# Patient Record
Sex: Male | Born: 1955 | Race: White | Hispanic: No | Smoking: Never smoker
Health system: Southern US, Community
[De-identification: ages and names within clinical notes are randomized; demographics above are authoritative.]

## PROBLEM LIST (undated history)

## (undated) DIAGNOSIS — M545 Low back pain, unspecified: Secondary | ICD-10-CM

## (undated) DIAGNOSIS — N189 Chronic kidney disease, unspecified: Secondary | ICD-10-CM

## (undated) DIAGNOSIS — K219 Gastro-esophageal reflux disease without esophagitis: Secondary | ICD-10-CM

## (undated) DIAGNOSIS — E785 Hyperlipidemia, unspecified: Secondary | ICD-10-CM

## (undated) DIAGNOSIS — L409 Psoriasis, unspecified: Secondary | ICD-10-CM

## (undated) HISTORY — DX: Low back pain, unspecified: M54.50

## (undated) HISTORY — DX: Hyperlipidemia, unspecified: E78.5

## (undated) HISTORY — DX: Psoriasis, unspecified: L40.9

## (undated) HISTORY — PX: LUMBAR DISC SURGERY: SHX700

## (undated) HISTORY — PX: WISDOM TOOTH EXTRACTION: SHX21

## (undated) HISTORY — PX: BACK SURGERY: SHX140

## (undated) HISTORY — DX: Low back pain: M54.5

---

## 1977-05-24 HISTORY — PX: APPENDECTOMY: SHX54

## 1987-05-25 HISTORY — PX: ANKLE SURGERY: SHX546

## 2010-11-18 ENCOUNTER — Ambulatory Visit: Payer: Self-pay | Admitting: Chiropractic Medicine

## 2014-08-07 LAB — PSA

## 2015-02-18 ENCOUNTER — Other Ambulatory Visit: Payer: Self-pay | Admitting: Unknown Physician Specialty

## 2015-02-18 NOTE — Telephone Encounter (Signed)
Pt needs check further refills 

## 2015-02-24 DIAGNOSIS — M545 Low back pain, unspecified: Secondary | ICD-10-CM | POA: Insufficient documentation

## 2015-02-24 DIAGNOSIS — N181 Chronic kidney disease, stage 1: Secondary | ICD-10-CM | POA: Insufficient documentation

## 2015-02-24 DIAGNOSIS — I10 Essential (primary) hypertension: Secondary | ICD-10-CM

## 2015-02-24 DIAGNOSIS — G8929 Other chronic pain: Secondary | ICD-10-CM | POA: Insufficient documentation

## 2015-02-24 DIAGNOSIS — E785 Hyperlipidemia, unspecified: Secondary | ICD-10-CM | POA: Insufficient documentation

## 2015-02-24 DIAGNOSIS — L409 Psoriasis, unspecified: Secondary | ICD-10-CM | POA: Insufficient documentation

## 2015-02-25 ENCOUNTER — Encounter: Payer: Self-pay | Admitting: Unknown Physician Specialty

## 2015-02-25 ENCOUNTER — Ambulatory Visit (INDEPENDENT_AMBULATORY_CARE_PROVIDER_SITE_OTHER): Payer: BC Managed Care – PPO | Admitting: Unknown Physician Specialty

## 2015-02-25 VITALS — BP 110/74 | HR 64 | Temp 98.2°F | Ht 67.3 in | Wt 187.8 lb

## 2015-02-25 DIAGNOSIS — I10 Essential (primary) hypertension: Secondary | ICD-10-CM | POA: Diagnosis not present

## 2015-02-25 DIAGNOSIS — E785 Hyperlipidemia, unspecified: Secondary | ICD-10-CM

## 2015-02-25 DIAGNOSIS — N181 Chronic kidney disease, stage 1: Secondary | ICD-10-CM

## 2015-02-25 DIAGNOSIS — Z23 Encounter for immunization: Secondary | ICD-10-CM

## 2015-02-25 LAB — MICROALBUMIN, URINE WAIVED
Creatinine, Urine Waived: 100 mg/dL (ref 10–300)
Microalb, Ur Waived: 80 mg/L — ABNORMAL HIGH (ref 0–19)

## 2015-02-25 LAB — LIPID PANEL PICCOLO, WAIVED
CHOLESTEROL PICCOLO, WAIVED: 158 mg/dL (ref <200)
Chol/HDL Ratio Piccolo,Waive: 3.1 mg/dL
HDL CHOL PICCOLO, WAIVED: 52 mg/dL — AB (ref >59)
LDL Chol Calc Piccolo Waived: 85 mg/dL (ref <100)
TRIGLYCERIDES PICCOLO,WAIVED: 110 mg/dL (ref <150)
VLDL CHOL CALC PICCOLO,WAIVE: 22 mg/dL (ref <30)

## 2015-02-25 MED ORDER — LISINOPRIL 2.5 MG PO TABS: 2.5000 mg | ORAL_TABLET | Freq: Every day | ORAL | Status: DC

## 2015-02-25 MED ORDER — CLOBETASOL PROPIONATE 0.05 % EX CREA: 1.0000 "application " | TOPICAL_CREAM | Freq: Two times a day (BID) | CUTANEOUS | Status: DC

## 2015-02-25 MED ORDER — ATORVASTATIN CALCIUM 20 MG PO TABS: 20.0000 mg | ORAL_TABLET | Freq: Every day | ORAL | Status: DC

## 2015-02-25 NOTE — Progress Notes (Signed)
BP 110/74 mmHg  Pulse 64  Temp(Src) 98.2 F (36.8 C)  Ht 5' 7.3" (1.709 m)  Wt 187 lb 12.8 oz (85.186 kg)  BMI 29.17 kg/m2  SpO2 97%   Subjective:    Patient ID: Colton Brown, male    DOB: 1955/09/08, 59 y.o.   MRN: [id number]  HPI: Colton Brown is a 59 y.o. male  Chief Complaint  Patient presents with  . Hyperlipidemia  . Hypertension  . Medication Refill    pt states he needs his clobetasol 0.05% cream refilled- requests 60 gram tube   Hypertension/Hyperlipidemia: Compliant with medications, denies missed doses. Has not monitored his blood pressure at home since starting lisinopril. Denies headaches, chest pain, shortness of breath or dizziness.  He remains active with stable weight.     Relevant past medical, surgical, family and social history reviewed and updated as indicated. Interim medical history since our last visit reviewed. Allergies and medications reviewed and updated.  Review of Systems  Constitutional: Negative.  Negative for fever, chills, activity change and appetite change.  HENT: Negative.  Negative for rhinorrhea, sneezing and sore throat.   Eyes: Negative.  Negative for redness and itching.  Respiratory: Negative.  Negative for cough, chest tightness, shortness of breath and wheezing.   Cardiovascular: Negative for chest pain, palpitations and leg swelling.  Gastrointestinal: Negative.  Negative for abdominal pain.  Genitourinary: Negative.   Musculoskeletal: Negative.  Negative for myalgias, joint swelling, arthralgias and gait problem.  Skin: Negative.  Negative for color change, pallor, rash and wound.  Neurological: Negative.  Negative for dizziness, seizures, light-headedness and headaches.  Psychiatric/Behavioral: Negative.  Negative for confusion and sleep disturbance. The patient is not nervous/anxious and is not hyperactive.     Per HPI unless specifically indicated above     Objective:    BP 110/74 mmHg  Pulse 64  Temp(Src) 98.2 F  (36.8 C)  Ht 5' 7.3" (1.709 m)  Wt 187 lb 12.8 oz (85.186 kg)  BMI 29.17 kg/m2  SpO2 97%  Wt Readings from Last 3 Encounters:  02/25/15 187 lb 12.8 oz (85.186 kg)  08/07/14 188 lb (85.276 kg)    Physical Exam  Constitutional: He is oriented to person, place, and time. He appears well-developed and well-nourished. No distress.  HENT:  Head: Normocephalic and atraumatic.  Eyes: Conjunctivae are normal. Right eye exhibits no discharge. Left eye exhibits no discharge.  Cardiovascular: Normal rate, regular rhythm and normal heart sounds.  Exam reveals no gallop and no friction rub.   No murmur heard. Pulmonary/Chest: Effort normal and breath sounds normal. No respiratory distress. He has no wheezes. He has no rales. He exhibits no tenderness.  Musculoskeletal: Normal range of motion. He exhibits no edema or tenderness.  Neurological: He is alert and oriented to person, place, and time.  Skin: Skin is warm and dry. No rash noted. He is not diaphoretic. No erythema. No pallor.  Psychiatric: He has a normal mood and affect. His behavior is normal. Judgment and thought content normal.        Assessment & Plan:   Problem List Items Addressed This Visit      Unprioritized   Hyperlipidemia    Well controlled on atorvastatin.      Relevant Medications   aspirin 81 MG tablet   atorvastatin (LIPITOR) 20 MG tablet   lisinopril (PRINIVIL,ZESTRIL) 2.5 MG tablet   Other Relevant Orders   Lipid Panel Piccolo, Waived   Hypertension    Well controlled on lisinopril.  Continue current regimen.       Relevant Medications   aspirin 81 MG tablet   atorvastatin (LIPITOR) 20 MG tablet   lisinopril (PRINIVIL,ZESTRIL) 2.5 MG tablet   Other Relevant Orders   Comprehensive metabolic panel   CKD (chronic kidney disease)    Stage 1. Blood pressure well controlled. CMP pending.      Relevant Orders   Microalbumin, Urine Waived   Uric acid    Other Visit Diagnoses    Immunization due    -   Primary    Relevant Orders    Flu Vaccine QUAD 36+ mos PF IM (Fluarix & Fluzone Quad PF) (Completed)        Follow up plan: Return in about 6 months (around 08/26/2015).

## 2015-02-25 NOTE — Assessment & Plan Note (Signed)
Well controlled on lisinopril. Continue current regimen.

## 2015-02-25 NOTE — Assessment & Plan Note (Addendum)
Reviewed lipid panel.  Well controlled on atorvastatin.

## 2015-02-25 NOTE — Assessment & Plan Note (Signed)
Stage 1. Blood pressure well controlled. CMP pending.

## 2015-02-26 ENCOUNTER — Encounter: Payer: Self-pay | Admitting: Unknown Physician Specialty

## 2015-02-26 LAB — COMPREHENSIVE METABOLIC PANEL
A/G RATIO: 1.9 (ref 1.1–2.5)
ALBUMIN: 4.6 g/dL (ref 3.5–5.5)
ALT: 41 IU/L (ref 0–44)
AST: 19 IU/L (ref 0–40)
Alkaline Phosphatase: 74 IU/L (ref 39–117)
BILIRUBIN TOTAL: 1 mg/dL (ref 0.0–1.2)
BUN / CREAT RATIO: 11 (ref 9–20)
BUN: 10 mg/dL (ref 6–24)
CALCIUM: 9.5 mg/dL (ref 8.7–10.2)
CHLORIDE: 100 mmol/L (ref 97–108)
CO2: 24 mmol/L (ref 18–29)
Creatinine, Ser: 0.91 mg/dL (ref 0.76–1.27)
GFR, EST AFRICAN AMERICAN: 108 mL/min/{1.73_m2} (ref >59)
GFR, EST NON AFRICAN AMERICAN: 93 mL/min/{1.73_m2} (ref >59)
Globulin, Total: 2.4 g/dL (ref 1.5–4.5)
Glucose: 100 mg/dL — ABNORMAL HIGH (ref 65–99)
POTASSIUM: 4.5 mmol/L (ref 3.5–5.2)
Sodium: 137 mmol/L (ref 134–144)
TOTAL PROTEIN: 7 g/dL (ref 6.0–8.5)

## 2015-02-26 LAB — URIC ACID: URIC ACID: 5.5 mg/dL (ref 3.7–8.6)

## 2015-08-26 ENCOUNTER — Telehealth: Payer: Self-pay

## 2015-08-26 ENCOUNTER — Ambulatory Visit (INDEPENDENT_AMBULATORY_CARE_PROVIDER_SITE_OTHER): Payer: BC Managed Care – PPO | Admitting: Unknown Physician Specialty

## 2015-08-26 ENCOUNTER — Encounter: Payer: Self-pay | Admitting: Unknown Physician Specialty

## 2015-08-26 VITALS — BP 114/72 | HR 66 | Temp 98.2°F | Ht 66.3 in | Wt 191.3 lb

## 2015-08-26 DIAGNOSIS — L409 Psoriasis, unspecified: Secondary | ICD-10-CM

## 2015-08-26 DIAGNOSIS — I10 Essential (primary) hypertension: Secondary | ICD-10-CM | POA: Diagnosis not present

## 2015-08-26 DIAGNOSIS — R1011 Right upper quadrant pain: Secondary | ICD-10-CM | POA: Diagnosis not present

## 2015-08-26 DIAGNOSIS — E785 Hyperlipidemia, unspecified: Secondary | ICD-10-CM

## 2015-08-26 LAB — LIPID PANEL PICCOLO, WAIVED
CHOL/HDL RATIO PICCOLO,WAIVE: 2.9 mg/dL
CHOLESTEROL PICCOLO, WAIVED: 159 mg/dL (ref <200)
HDL Chol Piccolo, Waived: 54 mg/dL — ABNORMAL LOW (ref >59)
LDL Chol Calc Piccolo Waived: 81 mg/dL (ref <100)
Triglycerides Piccolo,Waived: 118 mg/dL (ref <150)
VLDL Chol Calc Piccolo,Waive: 24 mg/dL (ref <30)

## 2015-08-26 MED ORDER — ATORVASTATIN CALCIUM 20 MG PO TABS: 20.0000 mg | ORAL_TABLET | Freq: Every day | ORAL | Status: DC

## 2015-08-26 MED ORDER — CLOBETASOL PROPIONATE 0.05 % EX CREA: 1.0000 "application " | TOPICAL_CREAM | Freq: Two times a day (BID) | CUTANEOUS | Status: DC

## 2015-08-26 MED ORDER — CLOBETASOL PROPIONATE 0.05 % EX SOLN: 1.0000 "application " | Freq: Two times a day (BID) | CUTANEOUS | Status: DC | PRN

## 2015-08-26 MED ORDER — LISINOPRIL 2.5 MG PO TABS: 2.5000 mg | ORAL_TABLET | Freq: Every day | ORAL | Status: DC

## 2015-08-26 NOTE — Assessment & Plan Note (Signed)
Pt reports improvement with medications; denies complication . Pt given a refill.

## 2015-08-26 NOTE — Assessment & Plan Note (Addendum)
LDL 81.  Continue current medication regimen.

## 2015-08-26 NOTE — Telephone Encounter (Signed)
Tried calling patient to notify him of his appointment to have an ultrasound.  No answer, left message for patient to return my phone call.   Appointment is 09-02-2015 at 8:45am. No food or drink 6 hours before, and arrive 15 minutes early. This will be at the [facility] off of [address].

## 2015-08-26 NOTE — Assessment & Plan Note (Addendum)
Pt report RUQ pain, intermittently, for several years. Pain present today. Collected Amylase and Lipase. Ordered abdominal ultrasound.

## 2015-08-26 NOTE — Progress Notes (Signed)
BP 114/72 mmHg  Pulse 66  Temp(Src) 98.2 F (36.8 C)  Ht 5' 6.3" (1.684 m)  Wt 191 lb 4.8 oz (86.773 kg)  BMI 30.60 kg/m2  SpO2 98%   Subjective:    Patient ID: Colton Brown, male    DOB: 07-08-1955, 60 y.o.   MRN: [id number]  HPI: Colton Brown is a 60 y.o. male  Chief Complaint  Patient presents with  . Hyperlipidemia  . Hypertension    Pt presents in clinic today for 6 month f/u on HTN and HLD. Pt reports doing well on medications and no complaints.Pt reports RUQ abdominal pain, intermittently for several years. Pt denies previous diagnosis to cause of abdominal pain. Per pt states that he was seen at a local ER in the 90's and told to return when he has active pain. Pt reported history of an negative abdominal ultrasound. Pt states he has the pain at least once a month; however, having the pain today. Pt also requesting medication refills for Clobetasol cream and solution for mild psoriasis.   Hypertension Using medications without difficulty. Average home BPs: do not take BP daily at home.    No problems or lightheadedness. No chest pain with exertion or shortness of breath No Edema   Hyperlipidemia Using medications without problems: No Muscle aches  Diet compliance: pt reports low-salt, fresh fruits and vegetable diet.  Exercise: pt goes twice a week to the gym for at least 1 hour/day.   Abdominal Pain Onset of RUQ pain last night, in the middle of the night.  Pain described as a non-radiating, achy pain and soreness. Denies N/V, CP, SOB No medications taken to relieve the pain, last night or today. In the past, pt reports tylenol has helped.  Pt was also prescribed Maalox to take a couple of years ago, but has stopped taking the medication.  Per pt the pain is worsened with eating greasy foods; which he has tried to remove from his diet.    Psoriasis Pt doing well with topical cream and solution for mild psoriasis.  Pt denies complications with medication Pt  requesting refill.    Relevant past medical, surgical, family and social history reviewed and updated as indicated. Interim medical history since our last visit reviewed. Allergies and medications reviewed and updated.  Review of Systems  Constitutional: Negative.  Negative for activity change, appetite change, fatigue and unexpected weight change.  Eyes: Negative.  Negative for visual disturbance.  Respiratory: Negative for cough, chest tightness and shortness of breath.   Cardiovascular: Negative for chest pain, palpitations and leg swelling.  Gastrointestinal: Positive for abdominal pain (RUQ pain, intermittently present ). Negative for nausea, vomiting, diarrhea, constipation, blood in stool and abdominal distention.  Genitourinary: Negative.   Musculoskeletal: Negative for myalgias and arthralgias.  Skin: Negative.   Neurological: Negative.  Negative for dizziness, syncope, weakness, light-headedness and headaches.  Psychiatric/Behavioral: Negative.     Per HPI unless specifically indicated above     Objective:    BP 114/72 mmHg  Pulse 66  Temp(Src) 98.2 F (36.8 C)  Ht 5' 6.3" (1.684 m)  Wt 191 lb 4.8 oz (86.773 kg)  BMI 30.60 kg/m2  SpO2 98%  Wt Readings from Last 3 Encounters:  08/26/15 191 lb 4.8 oz (86.773 kg)  02/25/15 187 lb 12.8 oz (85.186 kg)  08/07/14 188 lb (85.276 kg)    Physical Exam  Constitutional: He is oriented to person, place, and time. He appears well-developed and well-nourished.  HENT:  Head:  Normocephalic and atraumatic.  Eyes: Conjunctivae and EOM are normal. Pupils are equal, round, and reactive to light.  Neck: Normal range of motion. Neck supple. No JVD present. Carotid bruit is not present.  Cardiovascular: Normal rate, regular rhythm, normal heart sounds and intact distal pulses.   Pulmonary/Chest: Effort normal and breath sounds normal. No respiratory distress. He exhibits no tenderness.  Abdominal: Soft. Bowel sounds are normal. He  exhibits no distension and no mass. There is tenderness (pt reports a soreness at the RUQ). There is no rebound and no guarding.  Musculoskeletal: Normal range of motion. He exhibits no edema or tenderness.  Neurological: He is alert and oriented to person, place, and time.  Skin: Skin is warm and dry. No rash noted. No erythema.  Psychiatric: He has a normal mood and affect. His behavior is normal. Judgment and thought content normal.  Nursing note and vitals reviewed.   Results for orders placed or performed in visit on 02/25/15  Comprehensive metabolic panel  Result Value Ref Range   Glucose 100 (H) 65 - 99 mg/dL   BUN 10 6 - 24 mg/dL   Creatinine, Ser 0.91 0.76 - 1.27 mg/dL   GFR calc non Af Amer 93 >59 mL/min/1.73   GFR calc Af Amer 108 >59 mL/min/1.73   BUN/Creatinine Ratio 11 9 - 20   Sodium 137 134 - 144 mmol/L   Potassium 4.5 3.5 - 5.2 mmol/L   Chloride 100 97 - 108 mmol/L   CO2 24 18 - 29 mmol/L   Calcium 9.5 8.7 - 10.2 mg/dL   Total Protein 7.0 6.0 - 8.5 g/dL   Albumin 4.6 3.5 - 5.5 g/dL   Globulin, Total 2.4 1.5 - 4.5 g/dL   Albumin/Globulin Ratio 1.9 1.1 - 2.5   Bilirubin Total 1.0 0.0 - 1.2 mg/dL   Alkaline Phosphatase 74 39 - 117 IU/L   AST 19 0 - 40 IU/L   ALT 41 0 - 44 IU/L  Lipid Panel Piccolo, Waived  Result Value Ref Range   Cholesterol Piccolo, Waived 158 <200 mg/dL   HDL Chol Piccolo, Waived 52 (L) >59 mg/dL   Triglycerides Piccolo,Waived 110 <150 mg/dL   Chol/HDL Ratio Piccolo,Waive 3.1 mg/dL   LDL Chol Calc Piccolo Waived 85 <100 mg/dL   VLDL Chol Calc Piccolo,Waive 22 <30 mg/dL  Microalbumin, Urine Waived  Result Value Ref Range   Microalb, Ur Waived 80 (H) 0 - 19 mg/L   Creatinine, Urine Waived 100 10 - 300 mg/dL   Microalb/Creat Ratio 30-300 (H) <30 mg/g  Uric acid  Result Value Ref Range   Uric Acid 5.5 3.7 - 8.6 mg/dL      Assessment & Plan:   Problem List Items Addressed This Visit      Unprioritized   Hyperlipidemia - Primary    LDL  81.  Continue current medication regimen.      Relevant Medications   atorvastatin (LIPITOR) 20 MG tablet   lisinopril (PRINIVIL,ZESTRIL) 2.5 MG tablet   Other Relevant Orders   Lipid Panel Piccolo, Waived   Comprehensive metabolic panel   Hypertension    Stable, continue present medications.        Relevant Medications   atorvastatin (LIPITOR) 20 MG tablet   lisinopril (PRINIVIL,ZESTRIL) 2.5 MG tablet   Other Relevant Orders   Comprehensive metabolic panel   Psoriasis    Pt reports improvement with medications; denies complication . Pt given a refill.       Right upper quadrant pain  Pt report RUQ pain, intermittently, for several years. Pain present today. Collected Amylase and Lipase. Ordered abdominal ultrasound.      Relevant Orders   Amylase   Lipase   US Abdomen Limited RUQ       Follow up plan: Return in about 6 months (around 02/25/2016) for for physical.

## 2015-08-26 NOTE — Assessment & Plan Note (Addendum)
Stable, continue present medications.   

## 2015-08-27 ENCOUNTER — Encounter: Payer: Self-pay | Admitting: Unknown Physician Specialty

## 2015-08-27 LAB — COMPREHENSIVE METABOLIC PANEL
ALBUMIN: 4.6 g/dL (ref 3.5–5.5)
ALK PHOS: 67 IU/L (ref 39–117)
ALT: 35 IU/L (ref 0–44)
AST: 17 IU/L (ref 0–40)
Albumin/Globulin Ratio: 2.1 (ref 1.2–2.2)
BILIRUBIN TOTAL: 1 mg/dL (ref 0.0–1.2)
BUN / CREAT RATIO: 11 (ref 9–20)
BUN: 9 mg/dL (ref 6–24)
CHLORIDE: 100 mmol/L (ref 96–106)
CO2: 25 mmol/L (ref 18–29)
Calcium: 9.1 mg/dL (ref 8.7–10.2)
Creatinine, Ser: 0.81 mg/dL (ref 0.76–1.27)
GFR calc Af Amer: 113 mL/min/{1.73_m2} (ref >59)
GFR calc non Af Amer: 98 mL/min/{1.73_m2} (ref >59)
GLUCOSE: 96 mg/dL (ref 65–99)
Globulin, Total: 2.2 g/dL (ref 1.5–4.5)
Potassium: 4.2 mmol/L (ref 3.5–5.2)
SODIUM: 141 mmol/L (ref 134–144)
Total Protein: 6.8 g/dL (ref 6.0–8.5)

## 2015-08-27 LAB — AMYLASE: AMYLASE: 41 U/L (ref 31–124)

## 2015-08-27 LAB — LIPASE: Lipase: 30 U/L (ref 0–59)

## 2015-08-27 NOTE — Progress Notes (Signed)
Quick Note:  Normal labs. Patient notified by letter. ______ 

## 2015-08-27 NOTE — Telephone Encounter (Signed)
Patient notified

## 2015-09-02 ENCOUNTER — Ambulatory Visit
Admission: RE | Admit: 2015-09-02 | Discharge: 2015-09-02 | Disposition: A | Discharge status: Home / Self Care | Payer: BC Managed Care – PPO | Source: Ambulatory Visit | Attending: Unknown Physician Specialty | Admitting: Unknown Physician Specialty

## 2015-09-02 DIAGNOSIS — R1011 Right upper quadrant pain: Secondary | ICD-10-CM

## 2015-09-02 DIAGNOSIS — R932 Abnormal findings on diagnostic imaging of liver and biliary tract: Secondary | ICD-10-CM | POA: Diagnosis not present

## 2015-09-03 ENCOUNTER — Other Ambulatory Visit: Payer: Self-pay | Admitting: Unknown Physician Specialty

## 2015-09-03 DIAGNOSIS — R1011 Right upper quadrant pain: Secondary | ICD-10-CM

## 2015-09-03 DIAGNOSIS — K760 Fatty (change of) liver, not elsewhere classified: Secondary | ICD-10-CM

## 2015-10-07 ENCOUNTER — Ambulatory Visit (INDEPENDENT_AMBULATORY_CARE_PROVIDER_SITE_OTHER): Payer: BC Managed Care – PPO | Admitting: Gastroenterology

## 2015-10-07 ENCOUNTER — Encounter: Payer: Self-pay | Admitting: Gastroenterology

## 2015-10-07 VITALS — BP 123/70 | HR 86 | Temp 98.2°F | Ht 66.0 in | Wt 191.0 lb

## 2015-10-07 DIAGNOSIS — R1010 Upper abdominal pain, unspecified: Secondary | ICD-10-CM

## 2015-10-07 DIAGNOSIS — G8929 Other chronic pain: Secondary | ICD-10-CM | POA: Diagnosis not present

## 2015-10-07 DIAGNOSIS — R1084 Generalized abdominal pain: Secondary | ICD-10-CM | POA: Diagnosis not present

## 2015-10-07 DIAGNOSIS — R1011 Right upper quadrant pain: Principal | ICD-10-CM

## 2015-10-07 NOTE — Patient Instructions (Signed)
HIDA scan scheduled at [facility] on Monday, May 22nd @ 9:30am. Nothing to eat or drink 6 hours prior to exam. Check in at the [facility] entrance.

## 2015-10-07 NOTE — Progress Notes (Signed)
Gastroenterology Consultation  Referring Provider:     Kathrine Haddock, NP Primary Care Physician:  Kathrine Haddock, NP Primary Gastroenterologist:  Dr. Allen Norris     Reason for Consultation:     Right upper quadrant pain        HPI:   Colton Brown is a 60 y.o. y/o male referred for consultation & management of Right upper quadrant pain by Dr. Kathrine Haddock, NP.  This patient comes in today with a history of right upper quadrant pain that occurs approximately once a week. The patient has had this pain for 28 years and states that it is not exacerbated or relieved by anything that he can 0.2 except sometimes with greasy or fatty foods. The patient has been working out for many years but he reports that this is not anything that makes the pain better or worse. There is no report of any unexplained weight loss fevers chills nausea or vomiting. The patient also denies any black stools or bloody stools. The patient had a colonoscopy 8 years ago that he reports was normal. The patient also had a right upper quadrant ultrasound that showed a 1.5 mm polyp versus sludge in the neck of the gallbladder. The patient also was found to have increased fat in the liver.   Past Medical History  Diagnosis Date  . Hyperlipidemia   . Psoriasis   . Lumbago     Past Surgical History  Procedure Laterality Date  . Appendectomy  1979  . Ankle surgery Left 1989  . Lumbar disc surgery      L4-L5    Prior to Admission medications   Medication Sig Start Date End Date Taking? Authorizing Provider  aspirin 81 MG tablet Take 81 mg by mouth daily.   Yes Historical Provider, MD  atorvastatin (LIPITOR) 20 MG tablet Take 1 tablet (20 mg total) by mouth daily. 08/26/15  Yes Kathrine Haddock, NP  clobetasol (TEMOVATE) 0.05 % external solution Apply 1 application topically 2 (two) times daily as needed. 08/26/15  Yes Kathrine Haddock, NP  clobetasol cream (TEMOVATE) [id number] % Apply 1 application topically 2 (two) times daily. 08/26/15  Yes  Kathrine Haddock, NP  lisinopril (PRINIVIL,ZESTRIL) 2.5 MG tablet Take 1 tablet (2.5 mg total) by mouth daily. 08/26/15  Yes Kathrine Haddock, NP  TURMERIC PO Take 1 tablet by mouth daily.   Yes Historical Provider, MD    Family History  Problem Relation Age of Onset  . Asthma Mother   . Stroke Mother   . Migraines Mother   . Thyroid disease Mother   . Diabetes Mother   . Heart disease Father   . Hyperlipidemia Father   . Hypertension Father   . Stroke Father   . Hyperlipidemia Brother   . Hypertension Brother   . Diabetes Maternal Grandmother   . Diabetes Maternal Grandfather   . Heart disease Paternal Grandmother      Social History  Substance Use Topics  . Smoking status: Never Smoker   . Smokeless tobacco: Never Used  . Alcohol Use: No    Allergies as of 10/07/2015  . (No Known Allergies)    Review of Systems:    All systems reviewed and negative except where noted in HPI.   Physical Exam:  BP 123/70 mmHg  Pulse 86  Temp(Src) 98.2 F (36.8 C) (Oral)  Ht 5\' 6"  (1.676 m)  Wt 191 lb (86.637 kg)  BMI 30.84 kg/m2 No LMP for male patient. Psych:  Alert and cooperative. Normal  mood and affect. General:   Alert,  Well-developed, well-nourished, pleasant and cooperative in NAD Head:  Normocephalic and atraumatic. Eyes:  Sclera clear, no icterus.   Conjunctiva pink. Ears:  Normal auditory acuity. Nose:  No deformity, discharge, or lesions. Mouth:  No deformity or lesions,oropharynx pink & moist. Neck:  Supple; no masses or thyromegaly. Lungs:  Respirations even and unlabored.  Clear throughout to auscultation.   No wheezes, crackles, or rhonchi. No acute distress. Heart:  Regular rate and rhythm; no murmurs, clicks, rubs, or gallops. Abdomen:  Normal bowel sounds.  No bruits.  Soft, non-tender and non-distended without masses, hepatosplenomegaly or hernias noted.  No guarding or rebound tenderness.  Negative Carnett sign.   Rectal:  Deferred.  Msk:  Symmetrical without  gross deformities.  Good, equal movement & strength bilaterally. Pulses:  Normal pulses noted. Extremities:  No clubbing or edema.  No cyanosis. Neurologic:  Alert and oriented x3;  grossly normal neurologically. Skin:  Intact without significant lesions or rashes.  No jaundice. Lymph Nodes:  No significant cervical adenopathy. Psych:  Alert and cooperative. Normal mood and affect.  Imaging Studies: No results found.  Assessment and Plan:   Colton Brown is a 60 y.o. y/o male who comes in today with a history of right upper quadrant pain and it seems to be worse with greasy or fatty foods. The patient had a right upper quadrant ultrasound that did not show a cause for the discomfort and on physical exam the pain is not reproducible. The patient will be set up for a HIDA scan with CCK to rule out gallbladder disease. The patient has been explained the plan and agrees with it.   Note: This dictation was prepared with Dragon dictation along with smaller phrase technology. Any transcriptional errors that result from this process are unintentional.

## 2015-10-13 ENCOUNTER — Telehealth: Payer: Self-pay

## 2015-10-13 ENCOUNTER — Ambulatory Visit
Admission: RE | Admit: 2015-10-13 | Discharge: 2015-10-13 | Disposition: A | Discharge status: Home / Self Care | Payer: BC Managed Care – PPO | Source: Ambulatory Visit | Attending: Gastroenterology | Admitting: Gastroenterology

## 2015-10-13 DIAGNOSIS — R1084 Generalized abdominal pain: Secondary | ICD-10-CM | POA: Insufficient documentation

## 2015-10-13 DIAGNOSIS — R935 Abnormal findings on diagnostic imaging of other abdominal regions, including retroperitoneum: Secondary | ICD-10-CM | POA: Insufficient documentation

## 2015-10-13 DIAGNOSIS — R110 Nausea: Secondary | ICD-10-CM | POA: Diagnosis present

## 2015-10-13 MED ORDER — SINCALIDE 5 MCG IJ SOLR
0.0200 ug/kg | Freq: Once | INTRAMUSCULAR | Status: AC
  Administered 2015-10-13: 1.7 ug via INTRAVENOUS

## 2015-10-13 MED ORDER — TECHNETIUM TC 99M MEBROFENIN IV KIT
5.0000 | PACK | Freq: Once | INTRAVENOUS | Status: AC | PRN
  Administered 2015-10-13: 5.448 via INTRAVENOUS

## 2015-10-13 NOTE — Telephone Encounter (Signed)
LVM for pt to return my call.

## 2015-10-13 NOTE — Telephone Encounter (Signed)
Pt notified of HIDA scan results and scheduled appt with Dr. Azalee Course for consultation on Friday, May 26th.

## 2015-10-13 NOTE — Telephone Encounter (Signed)
-----   Message from Lucilla Lame, MD sent at 10/13/2015 12:26 PM EDT ----- Let the patient know that the gallbladder emptying study showed his gallbladder emptying at 1% at 1 hour which should be over 40%. This is an indication that his gallbladder is not working well. The patient should be set up with a surgeon for possible cholecystectomy.

## 2015-10-13 NOTE — Telephone Encounter (Signed)
Patient returned your phone call.

## 2015-10-14 NOTE — Telephone Encounter (Signed)
-----   Message from Lucilla Lame, MD sent at 10/13/2015 12:26 PM EDT ----- Let the patient know that the gallbladder emptying study showed his gallbladder emptying at 1% at 1 hour which should be over 40%. This is an indication that his gallbladder is not working well. The patient should be set up with a surgeon for possible cholecystectomy.

## 2015-10-17 ENCOUNTER — Ambulatory Visit (INDEPENDENT_AMBULATORY_CARE_PROVIDER_SITE_OTHER): Payer: BC Managed Care – PPO | Admitting: Surgery

## 2015-10-17 ENCOUNTER — Encounter: Payer: Self-pay | Admitting: Surgery

## 2015-10-17 ENCOUNTER — Encounter (INDEPENDENT_AMBULATORY_CARE_PROVIDER_SITE_OTHER): Payer: Self-pay

## 2015-10-17 VITALS — BP 127/81 | HR 77 | Temp 98.2°F | Ht 66.0 in | Wt 193.6 lb

## 2015-10-17 DIAGNOSIS — K8050 Calculus of bile duct without cholangitis or cholecystitis without obstruction: Secondary | ICD-10-CM | POA: Diagnosis not present

## 2015-10-17 DIAGNOSIS — K828 Other specified diseases of gallbladder: Secondary | ICD-10-CM | POA: Diagnosis not present

## 2015-10-17 MED ORDER — DEXTROSE 5 % IV SOLN: 2.0000 g | Freq: Once | INTRAVENOUS | Status: DC

## 2015-10-17 MED ORDER — DEXTROSE 5 % IV SOLN: 2.0000 g | Freq: Four times a day (QID) | INTRAVENOUS | Status: DC

## 2015-10-17 NOTE — Progress Notes (Signed)
Subjective:     Patient ID: Colton Brown, male   DOB: 06-Apr-1956, 60 y.o.   MRN: [id number]  HPI  60 yr old males Symptoms of right upper quadrant pain intermittently after eating greasy foods for about 28 years. Patient states that off and on he's been having these pains are becoming more frequent at this time. Patient states that the pain comes on usually after he eats greasy foods. Patient states that the pain is about a 6-7 out of 10 and comes and goes in waves. Patient states that is always in his right upper quadrant does not move. Patient denies any nausea with it or vomiting. Patient does state he does get some increased bloating some diarrhea and foul-smelling stools. Patient states that when he does get these bouts that'll be there for a few days before it calms down. The patient denies fever, chills, nausea, vomiting, other abdominal pain, constipation, dysuria or hematuria.  Past Medical History  Diagnosis Date  . Hyperlipidemia   . Psoriasis   . Lumbago    Past Surgical History  Procedure Laterality Date  . Appendectomy  1979  . Ankle surgery Left 1989  . Lumbar disc surgery      L4-L5   Family History  Problem Relation Age of Onset  . Asthma Mother   . Stroke Mother   . Migraines Mother   . Thyroid disease Mother   . Diabetes Mother   . Heart disease Father   . Hyperlipidemia Father   . Hypertension Father   . Stroke Father   . Hyperlipidemia Brother   . Hypertension Brother   . Diabetes Maternal Grandmother   . Diabetes Maternal Grandfather   . Heart disease Paternal Grandmother    Social History   Social History  . Marital Status: Divorced    Spouse Name: N/A  . Number of Children: N/A  . Years of Education: N/A   Social History Main Topics  . Smoking status: Never Smoker   . Smokeless tobacco: Never Used  . Alcohol Use: No  . Drug Use: No  . Sexual Activity: Yes   Other Topics Concern  . None   Social History Narrative    Current outpatient  prescriptions:  .  aspirin 81 MG tablet, Take 81 mg by mouth daily., Disp: , Rfl:  .  atorvastatin (LIPITOR) 20 MG tablet, Take 1 tablet (20 mg total) by mouth daily., Disp: 90 tablet, Rfl: 1 .  clobetasol (TEMOVATE) 0.05 % external solution, Apply 1 application topically 2 (two) times daily as needed., Disp: 50 mL, Rfl: 12 .  clobetasol cream (TEMOVATE) [id number] %, Apply 1 application topically 2 (two) times daily., Disp: 60 g, Rfl: 1 .  lisinopril (PRINIVIL,ZESTRIL) 2.5 MG tablet, Take 1 tablet (2.5 mg total) by mouth daily., Disp: 90 tablet, Rfl: 1 .  TURMERIC PO, Take 1 tablet by mouth daily., Disp: , Rfl:  No Known Allergies     Review of Systems  Constitutional: Positive for appetite change. Negative for fever, chills, activity change, fatigue and unexpected weight change.  HENT: Negative for congestion and sore throat.   Respiratory: Negative for cough, choking, chest tightness and stridor.   Cardiovascular: Negative for chest pain, palpitations and leg swelling.  Gastrointestinal: Positive for vomiting, abdominal pain, diarrhea and abdominal distention. Negative for nausea, constipation and blood in stool.  Genitourinary: Negative for frequency, flank pain and difficulty urinating.  Musculoskeletal: Positive for back pain. Negative for neck stiffness.  Skin: Negative for color change, pallor,  rash and wound.  Neurological: Negative for dizziness and weakness.  Hematological: Negative for adenopathy. Does not bruise/bleed easily.  Psychiatric/Behavioral: Negative for agitation. The patient is not nervous/anxious.   All other systems reviewed and are negative.      Filed Vitals:   10/17/15 1450  BP: 127/81  Pulse: 77  Temp: 98.2 F (36.8 C)    Objective:   Physical Exam  Constitutional: He is oriented to person, place, and time. He appears well-developed and well-nourished. No distress.  HENT:  Head: Normocephalic and atraumatic.  Right Ear: External ear normal.  Left Ear:  External ear normal.  Nose: Nose normal.  Mouth/Throat: Oropharynx is clear and moist. No oropharyngeal exudate.  Eyes: Conjunctivae and EOM are normal. Pupils are equal, round, and reactive to light. No scleral icterus.  Neck: Normal range of motion. Neck supple. No tracheal deviation present.  Cardiovascular: Normal rate, regular rhythm, normal heart sounds and intact distal pulses.  Exam reveals no gallop and no friction rub.   No murmur heard. Pulmonary/Chest: Effort normal and breath sounds normal. No respiratory distress. He has no wheezes. He has no rales.  Abdominal: Soft. Bowel sounds are normal. He exhibits no distension. There is tenderness. There is no rebound and no guarding.  Mild tenderness in the right upper quadrant, negative Murphys sign, well healed scar in the right lower quadrant from previous appendectomy, small less than 2 cm umbilical hernia easily reducible nontender  Musculoskeletal: Normal range of motion. He exhibits no edema or tenderness.  Neurological: He is alert and oriented to person, place, and time. No cranial nerve deficit.  Skin: Skin is warm and dry. No rash noted. No erythema. No pallor.  Psychiatric: He has a normal mood and affect. His behavior is normal. Judgment and thought content normal.  Vitals reviewed.      CMP Latest Ref Rng 08/26/2015 02/25/2015  Glucose 65 - 99 mg/dL 96 100(H)  BUN 6 - 24 mg/dL 9 10  Creatinine 0.76 - 1.27 mg/dL 0.81 0.91  Sodium 134 - 144 mmol/L 141 137  Potassium 3.5 - 5.2 mmol/L 4.2 4.5  Chloride 96 - 106 mmol/L 100 100  CO2 18 - 29 mmol/L 25 24  Calcium 8.7 - 10.2 mg/dL 9.1 9.5  Total Protein 6.0 - 8.5 g/dL 6.8 7.0  Total Bilirubin 0.0 - 1.2 mg/dL 1.0 1.0  Alkaline Phos 39 - 117 IU/L 67 74  AST 0 - 40 IU/L 17 19  ALT 0 - 44 IU/L 35 41    U/S: no stones, normal gb wall, no thickening or inflammation, small 1.84mm polyp  HIDA: Gallbladder visualized normally but with low EF of 1% Assessment:     60 yr old with  biliary dyskinesia and biliary colic and small umbilical hernia    Plan:     Personally reviewed the patient's past medical history is otherwise healthy except for some benign hypertension and 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 I personally reviewed the patient's laboratory values with normal liver functions. I personally reviewed the patient's ultrasound images which didn't show any gallbladder wall thickening or sludge are calcifications but did note a very small polyp. I also personally reviewed his HIDA scan images which it should good uptake in the gallbladder but poor ejection was confirmed by the radiology read as above.   I discussed with the patient given these results having his gallbladder removed with likely resolve his symptoms. The risks, benefits, complications, treatment options, and expected outcomes were  discussed with the patient. The possibilities of bleeding, recurrent infection, finding a normal gallbladder, perforation of viscus organs, damage to surrounding structures, bile leak, abscess formation, needing a drain placed, the need for additional procedures, reaction to medication, pulmonary aspiration,  failure to diagnose a condition, the possible need to convert to an open procedure, and creating a complication requiring transfusion or operation were discussed with the patient. The patient  concurred with the proposed plan, giving informed consent. We will schedule him for Laparoscopic Cholecystectomy in the week of June 19-24th.

## 2015-10-17 NOTE — Patient Instructions (Signed)
Please call our office if you have questions or concerns.    Laparoscopic Cholecystectomy Laparoscopic cholecystectomy is surgery to remove the gallbladder. The gallbladder is located in the upper right part of the abdomen, behind the liver. It is a storage sac for bile, which is produced in the liver. Bile aids in the digestion and absorption of fats. Cholecystectomy is often done for inflammation of the gallbladder (cholecystitis). This condition is usually caused by a buildup of gallstones (cholelithiasis) in the gallbladder. Gallstones can block the flow of bile, and that can result in inflammation and pain. In severe cases, emergency surgery may be required. If emergency surgery is not required, you will have time to prepare for the procedure. Laparoscopic surgery is an alternative to open surgery. Laparoscopic surgery has a shorter recovery time. Your common bile duct may also need to be examined during the procedure. If stones are found in the common bile duct, they may be removed. LET [facility] CARE PROVIDER KNOW ABOUT:  Any allergies you have.  All medicines you are taking, including vitamins, herbs, eye drops, creams, and over-the-counter medicines.  Previous problems you or members of your family have had with the use of anesthetics.  Any blood disorders you have.  Previous surgeries you have had.  Any medical conditions you have. RISKS AND COMPLICATIONS Generally, this is a safe procedure. However, problems may occur, including:  Infection.  Bleeding.  Allergic reactions to medicines.  Damage to other structures or organs.  A stone remaining in the common bile duct.  A bile leak from the cyst duct that is clipped when your gallbladder is removed.  The need to convert to open surgery, which requires a larger incision in the abdomen. This may be necessary if your surgeon thinks that it is not safe to continue with a laparoscopic procedure. BEFORE THE PROCEDURE  Ask  your health care provider about:  Changing or stopping your regular medicines. This is especially important if you are taking diabetes medicines or blood thinners.  Taking medicines such as aspirin and ibuprofen. These medicines can thin your blood. Do not take these medicines before your procedure if your health care provider instructs you not to.  Follow instructions from your health care provider about eating or drinking restrictions.  Let your health care provider know if you develop a cold or an infection before surgery.  Plan to have someone take you home after the procedure.  Ask your health care provider how your surgical site will be marked or identified.  You may be given antibiotic medicine to help prevent infection. PROCEDURE  To reduce your risk of infection:  Your health care team will wash or sanitize their hands.  Your skin will be washed with soap.  An IV tube may be inserted into one of your veins.  You will be given a medicine to make you fall asleep (general anesthetic).  A breathing tube will be placed in your mouth.  The surgeon will make several small cuts (incisions) in your abdomen.  A thin, lighted tube (laparoscope) that has a tiny camera on the end will be inserted through one of the small incisions. The camera on the laparoscope will send a picture to a TV screen (monitor) in the operating room. This will give the surgeon a good view inside your abdomen.  A gas will be pumped into your abdomen. This will expand your abdomen to give the surgeon more room to perform the surgery.  Other tools that are needed  for the procedure will be inserted through the other incisions. The gallbladder will be removed through one of the incisions.  After your gallbladder has been removed, the incisions will be closed with stitches (sutures), staples, or skin glue.  Your incisions may be covered with a bandage (dressing). The procedure may vary among health care  providers and hospitals. AFTER THE PROCEDURE  Your blood pressure, heart rate, breathing rate, and blood oxygen level will be monitored often until the medicines you were given have worn off.  You will be given medicines as needed to control your pain.   This information is not intended to replace advice given to you by your health care provider. Make sure you discuss any questions you have with your health care provider.   Document Released: 05/10/2005 Document Revised: 01/29/2015 Document Reviewed: 12/20/2012 Elsevier Interactive Patient Education [employer].

## 2015-10-21 ENCOUNTER — Telehealth: Payer: Self-pay

## 2015-10-21 ENCOUNTER — Telehealth: Payer: Self-pay | Admitting: Surgery

## 2015-10-21 NOTE — Telephone Encounter (Signed)
I spoke with Colton Brown and let him know to stop his Aspirin 2 days prior to his surgery. He verbalized understanding.

## 2015-10-21 NOTE — Telephone Encounter (Signed)
LVM for patient ot call office- He should stop his Aspirin 2 days prior to having his SX.

## 2015-10-21 NOTE — Telephone Encounter (Signed)
Pt advised of pre op date/time and sx date. Sx: 11/11/15 with Dr Lenore Manner Cholecystectomy.  Pre op: 11/05/15 between 9-1:00pm--Phone.   Patient made aware to call [phone], between 1-3:00pm the day before surgery, to find out what time to arrive.    Patient has agreed to make payment of 250.00 1 week prior to surgery for physician estimate.

## 2015-11-05 ENCOUNTER — Other Ambulatory Visit: Payer: BC Managed Care – PPO

## 2015-11-05 ENCOUNTER — Encounter: Payer: Self-pay | Admitting: *Deleted

## 2015-11-05 NOTE — Patient Instructions (Signed)
  Your procedure is scheduled on: 11-11-15 Report to [place] To find out your arrival time please call [phone] between 1PM - 3PM on 11-10-15  Remember: Instructions that are not followed completely may result in serious medical risk, up to and including death, or upon the discretion of your surgeon and anesthesiologist your surgery may need to be rescheduled.    _X___ 1. Do not eat food or drink liquids after midnight. No gum chewing or hard candies.     _X___ 2. No Alcohol for 24 hours before or after surgery.   ____ 3. Do Not Smoke For 24 Hours Prior to Your Surgery.   ____ 4. Bring all medications with you on the day of surgery if instructed.    ____ 5. Notify your doctor if there is any change in your medical condition     (cold, fever, infections).       Do not wear jewelry, make-up, hairpins, clips or nail polish.  Do not wear lotions, powders, or perfumes. You may wear deodorant.  Do not shave 48 hours prior to surgery. Men may shave face and neck.  Do not bring valuables to the hospital.    [facility] is not responsible for any belongings or valuables.               Contacts, dentures or bridgework may not be worn into surgery.  Leave your suitcase in the car. After surgery it may be brought to your room.  For patients admitted to the hospital, discharge time is determined by your treatment team.   Patients discharged the day of surgery will not be allowed to drive home.   Please read over the following fact sheets that you were given:     _X___ Take these medicines the morning of surgery with A SIP OF WATER:    1. LISINOPRIL  2.   3.   4.  5.  6.  ____ Fleet Enema (as directed)   ____ Use CHG Soap as directed  ____ Use inhalers on the day of surgery  ____ Stop metformin 2 days prior to surgery    ____ Take 1/2 of usual insulin dose the night before surgery and none on the morning of surgery.   _X___ Stop  Coumadin/Plavix/aspirin-PT TO STOP ASA 2 DAYS PRIOR TO SURGERY PER NOTE IN EPIC-PT AWARE TO STOP ON 11-08-15 (SATURDAY)  _X___ Stop Anti-inflammatories-NO NSAIDS OR ASA PRODUCTS-TYLENOL OK TO TAKE   _X___ Stop supplements until after surgery-STOP TURMERIC NOW  ____ Bring C-Pap to the hospital.

## 2015-11-11 ENCOUNTER — Encounter: Payer: Self-pay | Admitting: *Deleted

## 2015-11-11 ENCOUNTER — Ambulatory Visit
Admission: RE | Admit: 2015-11-11 | Discharge: 2015-11-11 | Disposition: A | Discharge status: Home / Self Care | Payer: BC Managed Care – PPO | Source: Ambulatory Visit | Attending: Surgery | Admitting: Surgery

## 2015-11-11 ENCOUNTER — Encounter
Admission: RE | Disposition: A | Discharge status: Home / Self Care | Payer: Self-pay | Source: Ambulatory Visit | Attending: Surgery

## 2015-11-11 ENCOUNTER — Ambulatory Visit: Payer: BC Managed Care – PPO | Admitting: Anesthesiology

## 2015-11-11 DIAGNOSIS — K8010 Calculus of gallbladder with chronic cholecystitis without obstruction: Secondary | ICD-10-CM | POA: Insufficient documentation

## 2015-11-11 DIAGNOSIS — L409 Psoriasis, unspecified: Secondary | ICD-10-CM | POA: Insufficient documentation

## 2015-11-11 DIAGNOSIS — K219 Gastro-esophageal reflux disease without esophagitis: Secondary | ICD-10-CM | POA: Diagnosis not present

## 2015-11-11 DIAGNOSIS — K660 Peritoneal adhesions (postprocedural) (postinfection): Secondary | ICD-10-CM | POA: Insufficient documentation

## 2015-11-11 DIAGNOSIS — K811 Chronic cholecystitis: Secondary | ICD-10-CM | POA: Insufficient documentation

## 2015-11-11 DIAGNOSIS — K828 Other specified diseases of gallbladder: Secondary | ICD-10-CM | POA: Diagnosis not present

## 2015-11-11 DIAGNOSIS — N189 Chronic kidney disease, unspecified: Secondary | ICD-10-CM | POA: Insufficient documentation

## 2015-11-11 DIAGNOSIS — I129 Hypertensive chronic kidney disease with stage 1 through stage 4 chronic kidney disease, or unspecified chronic kidney disease: Secondary | ICD-10-CM | POA: Insufficient documentation

## 2015-11-11 DIAGNOSIS — E785 Hyperlipidemia, unspecified: Secondary | ICD-10-CM | POA: Diagnosis not present

## 2015-11-11 HISTORY — PX: CHOLECYSTECTOMY: SHX55

## 2015-11-11 HISTORY — DX: Chronic kidney disease, unspecified: N18.9

## 2015-11-11 HISTORY — DX: Gastro-esophageal reflux disease without esophagitis: K21.9

## 2015-11-11 SURGERY — LAPAROSCOPIC CHOLECYSTECTOMY WITH INTRAOPERATIVE CHOLANGIOGRAM
Anesthesia: General | Wound class: Clean Contaminated

## 2015-11-11 MED ORDER — BUPIVACAINE HCL (PF) 0.5 % IJ SOLN
INTRAMUSCULAR | Status: DC | PRN
  Administered 2015-11-11: 30 mL

## 2015-11-11 MED ORDER — FENTANYL CITRATE (PF) 100 MCG/2ML IJ SOLN
INTRAMUSCULAR | Status: AC
  Administered 2015-11-11: 25 ug via INTRAVENOUS
  Filled 2015-11-11: qty 2

## 2015-11-11 MED ORDER — DEXAMETHASONE SODIUM PHOSPHATE 10 MG/ML IJ SOLN
INTRAMUSCULAR | Status: DC | PRN
  Administered 2015-11-11: 5 mg via INTRAVENOUS

## 2015-11-11 MED ORDER — CHLORHEXIDINE GLUCONATE 4 % EX LIQD: 1.0000 "application " | Freq: Once | CUTANEOUS | Status: DC

## 2015-11-11 MED ORDER — FAMOTIDINE 20 MG PO TABS
ORAL_TABLET | ORAL | Status: AC
  Administered 2015-11-11: 20 mg via ORAL
  Filled 2015-11-11: qty 1

## 2015-11-11 MED ORDER — FENTANYL CITRATE (PF) 100 MCG/2ML IJ SOLN
25.0000 ug | INTRAMUSCULAR | Status: AC | PRN
  Administered 2015-11-11 (×6): 25 ug via INTRAVENOUS

## 2015-11-11 MED ORDER — SUGAMMADEX SODIUM 200 MG/2ML IV SOLN
INTRAVENOUS | Status: DC | PRN
  Administered 2015-11-11: 175 mg via INTRAVENOUS
  Administered 2015-11-11: 200 mg via INTRAVENOUS

## 2015-11-11 MED ORDER — MIDAZOLAM HCL 2 MG/2ML IJ SOLN
INTRAMUSCULAR | Status: DC | PRN
  Administered 2015-11-11: 2 mg via INTRAVENOUS

## 2015-11-11 MED ORDER — CEFOXITIN SODIUM-DEXTROSE 2-2.2 GM-% IV SOLR (PREMIX)
2.0000 g | Freq: Once | INTRAVENOUS | Status: AC
  Administered 2015-11-11: 2000 mg via INTRAVENOUS

## 2015-11-11 MED ORDER — FAMOTIDINE 20 MG PO TABS
20.0000 mg | ORAL_TABLET | Freq: Once | ORAL | Status: AC
  Administered 2015-11-11: 20 mg via ORAL

## 2015-11-11 MED ORDER — ONDANSETRON HCL 4 MG/2ML IJ SOLN
INTRAMUSCULAR | Status: DC | PRN
  Administered 2015-11-11: 4 mg via INTRAVENOUS

## 2015-11-11 MED ORDER — HYDROMORPHONE HCL 1 MG/ML IJ SOLN
INTRAMUSCULAR | Status: DC | PRN
  Administered 2015-11-11: 0.5 mg via INTRAVENOUS

## 2015-11-11 MED ORDER — FENTANYL CITRATE (PF) 100 MCG/2ML IJ SOLN
INTRAMUSCULAR | Status: DC | PRN
  Administered 2015-11-11 (×3): 50 ug via INTRAVENOUS

## 2015-11-11 MED ORDER — LIDOCAINE HCL (CARDIAC) 20 MG/ML IV SOLN
INTRAVENOUS | Status: DC | PRN
  Administered 2015-11-11: 80 mg via INTRAVENOUS

## 2015-11-11 MED ORDER — OXYCODONE-ACETAMINOPHEN 5-325 MG PO TABS: 1.0000 | ORAL_TABLET | ORAL | Status: DC | PRN

## 2015-11-11 MED ORDER — BUPIVACAINE HCL (PF) 0.5 % IJ SOLN
INTRAMUSCULAR | Status: AC
  Filled 2015-11-11: qty 30

## 2015-11-11 MED ORDER — ONDANSETRON HCL 4 MG/2ML IJ SOLN: 4.0000 mg | Freq: Once | INTRAMUSCULAR | Status: DC | PRN

## 2015-11-11 MED ORDER — PROPOFOL 10 MG/ML IV BOLUS
INTRAVENOUS | Status: DC | PRN
  Administered 2015-11-11: 150 mg via INTRAVENOUS

## 2015-11-11 MED ORDER — ROCURONIUM BROMIDE 100 MG/10ML IV SOLN
INTRAVENOUS | Status: DC | PRN
  Administered 2015-11-11: 30 mg via INTRAVENOUS
  Administered 2015-11-11: 10 mg via INTRAVENOUS

## 2015-11-11 MED ORDER — CEFOXITIN SODIUM-DEXTROSE 2-2.2 GM-% IV SOLR (PREMIX)
INTRAVENOUS | Status: AC
  Administered 2015-11-11: 2000 mg via INTRAVENOUS
  Filled 2015-11-11: qty 50

## 2015-11-11 MED ORDER — ACETAMINOPHEN 10 MG/ML IV SOLN
INTRAVENOUS | Status: DC | PRN
  Administered 2015-11-11: 1000 mg via INTRAVENOUS

## 2015-11-11 MED ORDER — KETOROLAC TROMETHAMINE 30 MG/ML IJ SOLN
INTRAMUSCULAR | Status: DC | PRN
  Administered 2015-11-11: 30 mg via INTRAVENOUS

## 2015-11-11 MED ORDER — ACETAMINOPHEN 10 MG/ML IV SOLN
INTRAVENOUS | Status: AC
  Filled 2015-11-11: qty 100

## 2015-11-11 MED ORDER — LACTATED RINGERS IV SOLN
INTRAVENOUS | Status: DC
  Administered 2015-11-11 (×2): via INTRAVENOUS

## 2015-11-11 SURGICAL SUPPLY — 40 items
APPLIER CLIP 5 13 M/L LIGAMAX5 (MISCELLANEOUS) ×3
BLADE SURG 15 STRL LF DISP TIS (BLADE) ×1 IMPLANT
BLADE SURG 15 STRL SS (BLADE) ×2
CANISTER SUCT 1200ML W/VALVE (MISCELLANEOUS) ×3 IMPLANT
CATH CHOLANGI 4FR 420404F (CATHETERS) ×3 IMPLANT
CHLORAPREP W/TINT 26ML (MISCELLANEOUS) ×3 IMPLANT
CLIP APPLIE 5 13 M/L LIGAMAX5 (MISCELLANEOUS) ×1 IMPLANT
CONRAY 60ML FOR OR (MISCELLANEOUS) ×3 IMPLANT
DEFOGGER SCOPE WARMER CLEARIFY (MISCELLANEOUS) ×3 IMPLANT
DRAPE SHEET LG 3/4 BI-LAMINATE (DRAPES) ×3 IMPLANT
ELECT CAUTERY BLADE 6.4 (BLADE) ×3 IMPLANT
ELECT E-Z MONOPOLAR 33 (MISCELLANEOUS) ×3
ELECT REM PT RETURN 9FT ADLT (ELECTROSURGICAL) ×3
ELECTRODE E-Z MONOPOLAR 33 (MISCELLANEOUS) ×1 IMPLANT
ELECTRODE REM PT RTRN 9FT ADLT (ELECTROSURGICAL) ×1 IMPLANT
ENDOPOUCH RETRIEVER 10 (MISCELLANEOUS) ×3 IMPLANT
GLOVE PI ORTHOPRO 6.5 (GLOVE) ×2
GLOVE PI ORTHOPRO STRL 6.5 (GLOVE) ×1 IMPLANT
GOWN STRL REUS W/ TWL LRG LVL3 (GOWN DISPOSABLE) ×3 IMPLANT
GOWN STRL REUS W/TWL LRG LVL3 (GOWN DISPOSABLE) ×6
IRRIGATION STRYKERFLOW (MISCELLANEOUS) ×1 IMPLANT
IRRIGATOR STRYKERFLOW (MISCELLANEOUS) ×3
IV CATH ANGIO 12GX3 LT BLUE (NEEDLE) ×3 IMPLANT
IV NS 1000ML (IV SOLUTION) ×2
IV NS 1000ML BAXH (IV SOLUTION) ×1 IMPLANT
LABEL OR SOLS (LABEL) ×3 IMPLANT
LIQUID BAND (GAUZE/BANDAGES/DRESSINGS) ×3 IMPLANT
NEEDLE HYPO 25X1 1.5 SAFETY (NEEDLE) ×3 IMPLANT
NS IRRIG 500ML POUR BTL (IV SOLUTION) ×3 IMPLANT
PACK LAP CHOLECYSTECTOMY (MISCELLANEOUS) ×3 IMPLANT
PENCIL ELECTRO HAND CTR (MISCELLANEOUS) ×3 IMPLANT
SCISSORS METZENBAUM CVD 33 (INSTRUMENTS) ×3 IMPLANT
SLEEVE ENDOPATH XCEL 5M (ENDOMECHANICALS) ×6 IMPLANT
SUT MNCRL 4-0 (SUTURE) ×2
SUT MNCRL 4-0 27XMFL (SUTURE) ×1
SUT VICRYL 0 AB UR-6 (SUTURE) ×6 IMPLANT
SUTURE MNCRL 4-0 27XMF (SUTURE) ×1 IMPLANT
TROCAR XCEL BLUNT TIP 100MML (ENDOMECHANICALS) ×3 IMPLANT
TROCAR XCEL NON-BLD 5MMX100MML (ENDOMECHANICALS) ×3 IMPLANT
TUBING INSUFFLATOR HI FLOW (MISCELLANEOUS) ×3 IMPLANT

## 2015-11-11 NOTE — Discharge Instructions (Signed)
AMBULATORY SURGERY  °DISCHARGE INSTRUCTIONS ° ° °1) The drugs that you were given will stay in your system until tomorrow so for the next 24 hours you should not: ° °A) Drive an automobile °B) Make any legal decisions °C) Drink any alcoholic beverage ° ° °2) You may resume regular meals tomorrow.  Today it is better to start with liquids and gradually work up to solid foods. ° °You may eat anything you prefer, but it is better to start with liquids, then soup and crackers, and gradually work up to solid foods. ° ° °3) Please notify your doctor immediately if you have any unusual bleeding, trouble breathing, redness and pain at the surgery site, drainage, fever, or pain not relieved by medication. ° ° ° °4) Additional Instructions: ° ° ° ° ° ° ° °Please contact your physician with any problems or Same Day Surgery at [phone], Monday through Friday 6 am to 4 pm, or [place] [facility] [place] Main number at [phone]. °

## 2015-11-11 NOTE — Anesthesia Preprocedure Evaluation (Signed)
Anesthesia Evaluation  Patient identified by MRN, date of birth, ID band Patient awake    Reviewed: Allergy & Precautions, NPO status , Patient's Chart, lab work & pertinent test results, reviewed documented beta blocker date and time   Airway Mallampati: II  TM Distance: >3 FB     Dental  (+) Chipped   Pulmonary           Cardiovascular hypertension,      Neuro/Psych    GI/Hepatic GERD  Controlled,  Endo/Other    Renal/GU Renal InsufficiencyRenal disease     Musculoskeletal   Abdominal   Peds  Hematology   Anesthesia Other Findings   Reproductive/Obstetrics                             Anesthesia Physical Anesthesia Plan  ASA: III  Anesthesia Plan: General   Post-op Pain Management:    Induction: Intravenous  Airway Management Planned: Oral ETT  Additional Equipment:   Intra-op Plan:   Post-operative Plan:   Informed Consent: I have reviewed the patients History and Physical, chart, labs and discussed the procedure including the risks, benefits and alternatives for the proposed anesthesia with the patient or authorized representative who has indicated his/her understanding and acceptance.     Plan Discussed with: CRNA  Anesthesia Plan Comments:         Anesthesia Quick Evaluation

## 2015-11-11 NOTE — Anesthesia Postprocedure Evaluation (Signed)
Anesthesia Post Note  Patient: Colton Brown  Procedure(s) Performed: Procedure(s) (LRB): LAPAROSCOPIC CHOLECYSTECTOMY WITH INTRAOPERATIVE CHOLANGIOGRAM (N/A)  Patient location during evaluation: PACU Anesthesia Type: General Level of consciousness: awake and alert Pain management: pain level controlled Vital Signs Assessment: post-procedure vital signs reviewed and stable Respiratory status: spontaneous breathing, nonlabored ventilation, respiratory function stable and patient connected to nasal cannula oxygen Cardiovascular status: blood pressure returned to baseline and stable Postop Assessment: no signs of nausea or vomiting Anesthetic complications: no    Last Vitals:  Filed Vitals:   11/11/15 1020 11/11/15 1102  BP: 124/78 122/77  Pulse: 67 62  Temp: 35.6 C   Resp: 16 16    Last Pain:  Filed Vitals:   11/11/15 1103  PainSc: [place]

## 2015-11-11 NOTE — Anesthesia Procedure Notes (Signed)
Procedure Name: Intubation Date/Time: 11/11/2015 8:20 AM Performed by: Justus Memory Pre-anesthesia Checklist: Patient identified, Emergency Drugs available, Suction available and Patient being monitored Patient Re-evaluated:Patient Re-evaluated prior to inductionOxygen Delivery Method: Circle system utilized Preoxygenation: Pre-oxygenation with 100% oxygen Intubation Type: IV induction Ventilation: Mask ventilation without difficulty Laryngoscope Size: Mac and 3 Grade View: Grade III Tube type: Oral Tube size: 7.0 mm Number of attempts: 1 Airway Equipment and Method: Stylet Placement Confirmation: ETT inserted through vocal cords under direct vision,  positive ETCO2 and breath sounds checked- equal and bilateral Secured at: 21 cm Tube secured with: Tape Dental Injury: Teeth and Oropharynx as per pre-operative assessment  Difficulty Due To: Difficulty was unanticipated Future Recommendations: Recommend- induction with short-acting agent, and alternative techniques readily available

## 2015-11-11 NOTE — Interval H&P Note (Signed)
History and Physical Interval Note:  11/11/2015 7:06 AM  Colton Brown  has presented today for surgery, with the diagnosis of Biliary dyskinesia  The various methods of treatment have been discussed with the patient and family. After consideration of risks, benefits and other options for treatment, the patient has consented to  Procedure(s): LAPAROSCOPIC CHOLECYSTECTOMY WITH INTRAOPERATIVE CHOLANGIOGRAM (N/A) as a surgical intervention .  The patient's history has been reviewed, patient examined, no change in status, stable for surgery.  I have reviewed the patient's chart and labs.  Questions were answered to the patient's satisfaction.     Clarity Ciszek L Tinley Rought

## 2015-11-11 NOTE — Op Note (Signed)
Laparoscopic Cholecystectomy Procedure Note  Indications: This patient presents with symptomatic gallbladder disease and will undergo laparoscopic cholecystectomy.  Pre-operative Diagnosis: Chronic cholecystitis  Post-operative Diagnosis: Same  Surgeon: Hubbard Robinson   Assistants: none  Anesthesia: General endotracheal anesthesia  ASA Class: 1  Procedure Details  The patient was seen again in the Holding Room. The risks, benefits, complications, treatment options, and expected outcomes were discussed with the patient. The possibilities of reaction to medication, pulmonary aspiration, perforation of viscus, bleeding, recurrent infection, finding a normal gallbladder, the need for additional procedures, failure to diagnose a condition, the possible need to convert to an open procedure, and creating a complication requiring transfusion or operation were discussed with the patient. The patient and/or family concurred with the proposed plan, giving informed consent. The site of surgery properly noted/marked. The patient was taken to Operating Room, identified as Colton Brown and the procedure verified as Laparoscopic Cholecystectomy with Intraoperative Cholangiograms. A Time Out was held and the above information confirmed.  Prior to the induction of general anesthesia, antibiotic prophylaxis was administered. General endotracheal anesthesia was then administered and tolerated well. After the induction, the abdomen was prepped in the usual sterile fashion. The patient was positioned in the supine position with the left arm comfortably tucked, along with some reverse Trendelenburg.  Local anesthetic agent was injected into the skin near the umbilicus and an incision made. The midline fascia was incised and the Hasson technique was used to introduce a 10 mm port under direct vision. It was secured with two figure of eight Vicryl sutures placed in the usual fashion. Pneumoperitoneum was then created  with CO2 and tolerated well without any adverse changes in the patient's vital signs. Additional trocars were introduced under direct vision. All skin incisions were infiltrated with a local anesthetic agent before making the incision and placing the trocars.   The gallbladder was identified, the fundus grasped and retracted cephalad. Adhesions were lysed bluntly and with the electrocautery where indicated, taking care not to injure any adjacent organs or viscus. The infundibulum was grasped and retracted laterally, exposing the peritoneum overlying the triangle of Calot. This was then divided and exposed in a blunt fashion. The cystic duct was clearly identified and bluntly dissected circumferentially. The junctions of the gallbladder, cystic duct and common bile duct were clearly identified prior to the division of any linear structure.  The cystic duct was then doubly ligated with surgical clips on the patient side and singly clipped on the gallbladder side and divided. The cystic artery was identified, dissected free, ligated with clips and divided as well.   The gallbladder was dissected from the liver bed in retrograde fashion with the electrocautery. The gallbladder was removed. The liver bed was irrigated and inspected. Hemostasis was achieved with the electrocautery. Copious irrigation was utilized and was repeatedly aspirated until clear all particulate matter.  Pneumoperitoneum was completely reduced after viewing removal of the trocars under direct vision. The wound was thoroughly irrigated and the fascia was then closed with a figure of eight suture; the skin was then closed with 4-0 Monodryl and a sterile glue was applied.  Instrument, sponge, and needle counts were correct at closure and at the conclusion of the case.   Findings: Cholecystitis without Cholelithiasis  Estimated Blood Loss: less than 50 mL         Drains: none         Total IV Fluids: 1527mL         Specimens:  Gallbladder  Complications: None; patient tolerated the procedure well.         Disposition: PACU - hemodynamically stable.         Condition: stable

## 2015-11-11 NOTE — Transfer of Care (Signed)
Immediate Anesthesia Transfer of Care Note  Patient: Benjman Fremont  Procedure(s) Performed: Procedure(s): LAPAROSCOPIC CHOLECYSTECTOMY WITH INTRAOPERATIVE CHOLANGIOGRAM (N/A)  Patient Location: PACU  Anesthesia Type:General  Level of Consciousness: awake, alert  and oriented  Airway & Oxygen Therapy: Patient Spontanous Breathing and Patient connected to face mask oxygen  Post-op Assessment: Report given to RN and Post -op Vital signs reviewed and stable  Post vital signs: Reviewed and stable  Last Vitals:  Filed Vitals:   11/11/15 0612 11/11/15 0923  BP: 134/78 157/90  Pulse: 74 78  Temp: 36.6 C 36.7 C  Resp: 20 26    Last Pain: There were no vitals filed for this visit.    Patients Stated Pain Goal: 2 ([phone])  Complications: No apparent anesthesia complications

## 2015-11-11 NOTE — H&P (View-Only) (Signed)
Subjective:     Patient ID: Colton Brown, male   DOB: 13-Jan-1956, 60 y.o.   MRN: [id number]  HPI  60 yr old males Symptoms of right upper quadrant pain intermittently after eating greasy foods for about 28 years. Patient states that off and on he's been having these pains are becoming more frequent at this time. Patient states that the pain comes on usually after he eats greasy foods. Patient states that the pain is about a 6-7 out of 10 and comes and goes in waves. Patient states that is always in his right upper quadrant does not move. Patient denies any nausea with it or vomiting. Patient does state he does get some increased bloating some diarrhea and foul-smelling stools. Patient states that when he does get these bouts that'll be there for a few days before it calms down. The patient denies fever, chills, nausea, vomiting, other abdominal pain, constipation, dysuria or hematuria.  Past Medical History  Diagnosis Date  . Hyperlipidemia   . Psoriasis   . Lumbago    Past Surgical History  Procedure Laterality Date  . Appendectomy  1979  . Ankle surgery Left 1989  . Lumbar disc surgery      L4-L5   Family History  Problem Relation Age of Onset  . Asthma Mother   . Stroke Mother   . Migraines Mother   . Thyroid disease Mother   . Diabetes Mother   . Heart disease Father   . Hyperlipidemia Father   . Hypertension Father   . Stroke Father   . Hyperlipidemia Brother   . Hypertension Brother   . Diabetes Maternal Grandmother   . Diabetes Maternal Grandfather   . Heart disease Paternal Grandmother    Social History   Social History  . Marital Status: Divorced    Spouse Name: N/A  . Number of Children: N/A  . Years of Education: N/A   Social History Main Topics  . Smoking status: Never Smoker   . Smokeless tobacco: Never Used  . Alcohol Use: No  . Drug Use: No  . Sexual Activity: Yes   Other Topics Concern  . None   Social History Narrative    Current outpatient  prescriptions:  .  aspirin 81 MG tablet, Take 81 mg by mouth daily., Disp: , Rfl:  .  atorvastatin (LIPITOR) 20 MG tablet, Take 1 tablet (20 mg total) by mouth daily., Disp: 90 tablet, Rfl: 1 .  clobetasol (TEMOVATE) 0.05 % external solution, Apply 1 application topically 2 (two) times daily as needed., Disp: 50 mL, Rfl: 12 .  clobetasol cream (TEMOVATE) [id number] %, Apply 1 application topically 2 (two) times daily., Disp: 60 g, Rfl: 1 .  lisinopril (PRINIVIL,ZESTRIL) 2.5 MG tablet, Take 1 tablet (2.5 mg total) by mouth daily., Disp: 90 tablet, Rfl: 1 .  TURMERIC PO, Take 1 tablet by mouth daily., Disp: , Rfl:  No Known Allergies     Review of Systems  Constitutional: Positive for appetite change. Negative for fever, chills, activity change, fatigue and unexpected weight change.  HENT: Negative for congestion and sore throat.   Respiratory: Negative for cough, choking, chest tightness and stridor.   Cardiovascular: Negative for chest pain, palpitations and leg swelling.  Gastrointestinal: Positive for vomiting, abdominal pain, diarrhea and abdominal distention. Negative for nausea, constipation and blood in stool.  Genitourinary: Negative for frequency, flank pain and difficulty urinating.  Musculoskeletal: Positive for back pain. Negative for neck stiffness.  Skin: Negative for color change, pallor,  rash and wound.  Neurological: Negative for dizziness and weakness.  Hematological: Negative for adenopathy. Does not bruise/bleed easily.  Psychiatric/Behavioral: Negative for agitation. The patient is not nervous/anxious.   All other systems reviewed and are negative.      Filed Vitals:   10/17/15 1450  BP: 127/81  Pulse: 77  Temp: 98.2 F (36.8 C)    Objective:   Physical Exam  Constitutional: He is oriented to person, place, and time. He appears well-developed and well-nourished. No distress.  HENT:  Head: Normocephalic and atraumatic.  Right Ear: External ear normal.  Left Ear:  External ear normal.  Nose: Nose normal.  Mouth/Throat: Oropharynx is clear and moist. No oropharyngeal exudate.  Eyes: Conjunctivae and EOM are normal. Pupils are equal, round, and reactive to light. No scleral icterus.  Neck: Normal range of motion. Neck supple. No tracheal deviation present.  Cardiovascular: Normal rate, regular rhythm, normal heart sounds and intact distal pulses.  Exam reveals no gallop and no friction rub.   No murmur heard. Pulmonary/Chest: Effort normal and breath sounds normal. No respiratory distress. He has no wheezes. He has no rales.  Abdominal: Soft. Bowel sounds are normal. He exhibits no distension. There is tenderness. There is no rebound and no guarding.  Mild tenderness in the right upper quadrant, negative Murphys sign, well healed scar in the right lower quadrant from previous appendectomy, small less than 2 cm umbilical hernia easily reducible nontender  Musculoskeletal: Normal range of motion. He exhibits no edema or tenderness.  Neurological: He is alert and oriented to person, place, and time. No cranial nerve deficit.  Skin: Skin is warm and dry. No rash noted. No erythema. No pallor.  Psychiatric: He has a normal mood and affect. His behavior is normal. Judgment and thought content normal.  Vitals reviewed.      CMP Latest Ref Rng 08/26/2015 02/25/2015  Glucose 65 - 99 mg/dL 96 100(H)  BUN 6 - 24 mg/dL 9 10  Creatinine 0.76 - 1.27 mg/dL 0.81 0.91  Sodium 134 - 144 mmol/L 141 137  Potassium 3.5 - 5.2 mmol/L 4.2 4.5  Chloride 96 - 106 mmol/L 100 100  CO2 18 - 29 mmol/L 25 24  Calcium 8.7 - 10.2 mg/dL 9.1 9.5  Total Protein 6.0 - 8.5 g/dL 6.8 7.0  Total Bilirubin 0.0 - 1.2 mg/dL 1.0 1.0  Alkaline Phos 39 - 117 IU/L 67 74  AST 0 - 40 IU/L 17 19  ALT 0 - 44 IU/L 35 41    U/S: no stones, normal gb wall, no thickening or inflammation, small 1.61mm polyp  HIDA: Gallbladder visualized normally but with low EF of 1% Assessment:     60 yr old with  biliary dyskinesia and biliary colic and small umbilical hernia    Plan:     Personally reviewed the patient's past medical history is otherwise healthy except for some benign hypertension and 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 I personally reviewed the patient's laboratory values with normal liver functions. I personally reviewed the patient's ultrasound images which didn't show any gallbladder wall thickening or sludge are calcifications but did note a very small polyp. I also personally reviewed his HIDA scan images which it should good uptake in the gallbladder but poor ejection was confirmed by the radiology read as above.   I discussed with the patient given these results having his gallbladder removed with likely resolve his symptoms. The risks, benefits, complications, treatment options, and expected outcomes were  discussed with the patient. The possibilities of bleeding, recurrent infection, finding a normal gallbladder, perforation of viscus organs, damage to surrounding structures, bile leak, abscess formation, needing a drain placed, the need for additional procedures, reaction to medication, pulmonary aspiration,  failure to diagnose a condition, the possible need to convert to an open procedure, and creating a complication requiring transfusion or operation were discussed with the patient. The patient  concurred with the proposed plan, giving informed consent. We will schedule him for Laparoscopic Cholecystectomy in the week of June 19-24th.

## 2015-11-12 LAB — SURGICAL PATHOLOGY

## 2015-11-20 ENCOUNTER — Other Ambulatory Visit: Payer: Self-pay

## 2015-12-01 ENCOUNTER — Encounter: Payer: Self-pay | Admitting: Surgery

## 2015-12-01 ENCOUNTER — Ambulatory Visit (INDEPENDENT_AMBULATORY_CARE_PROVIDER_SITE_OTHER): Payer: BC Managed Care – PPO | Admitting: Surgery

## 2015-12-01 VITALS — BP 138/83 | HR 63 | Temp 97.8°F | Ht 66.0 in | Wt 190.0 lb

## 2015-12-01 DIAGNOSIS — K8050 Calculus of bile duct without cholangitis or cholecystitis without obstruction: Secondary | ICD-10-CM

## 2015-12-01 DIAGNOSIS — R1011 Right upper quadrant pain: Secondary | ICD-10-CM

## 2015-12-01 NOTE — Progress Notes (Signed)
60 yr old s/p Lap chole for symptomatic biliary dyskinesia.  Patient states doing well.  Only slight amount of soreness around incision sites.  Patient states that he has had a good appetite and has been eating even greasy foods. Patient does state that he has looser stools around the time  eating more greasy foods.  Patient states that due to his cholesterol medication he did have what he stated were "mushy" stools. I discussed with him this probably would not improve that but that the looser stools should get better and back to his baseline over time.   Filed Vitals:   12/01/15 1251  BP: 138/83  Pulse: 63  Temp: 97.8 F (36.6 C)   PE:  Gen: NAD Abd: soft, nt, incisions c/d/i, no erythema or drainage  A/P:  Patient doing well after leper scopic cholecystectomy discussed with him that he had chronic cholecystitis on his pathology. Did discuss with him that his stool should return back to his baseline within 6 weeks. Patient also stated that he had some soreness and some difficulty singing after the surgery I discussed with him this also should improve within a 6 week timeframe and that if I hadn't to give [place] a call.

## 2015-12-01 NOTE — Patient Instructions (Signed)
Please call our office if you have any questions or concerns.  

## 2016-03-10 ENCOUNTER — Encounter: Payer: BC Managed Care – PPO | Admitting: Unknown Physician Specialty

## 2016-03-12 ENCOUNTER — Encounter: Payer: Self-pay | Admitting: Unknown Physician Specialty

## 2016-03-12 ENCOUNTER — Ambulatory Visit (INDEPENDENT_AMBULATORY_CARE_PROVIDER_SITE_OTHER): Payer: BC Managed Care – PPO | Admitting: Unknown Physician Specialty

## 2016-03-12 VITALS — BP 128/77 | HR 76 | Temp 98.2°F | Ht 66.0 in | Wt 192.0 lb

## 2016-03-12 DIAGNOSIS — I10 Essential (primary) hypertension: Secondary | ICD-10-CM

## 2016-03-12 DIAGNOSIS — Z0000 Encounter for general adult medical examination without abnormal findings: Secondary | ICD-10-CM | POA: Diagnosis not present

## 2016-03-12 DIAGNOSIS — N181 Chronic kidney disease, stage 1: Secondary | ICD-10-CM | POA: Diagnosis not present

## 2016-03-12 DIAGNOSIS — E7800 Pure hypercholesterolemia, unspecified: Secondary | ICD-10-CM | POA: Diagnosis not present

## 2016-03-12 DIAGNOSIS — Z23 Encounter for immunization: Secondary | ICD-10-CM

## 2016-03-12 DIAGNOSIS — H5789 Other specified disorders of eye and adnexa: Secondary | ICD-10-CM

## 2016-03-12 DIAGNOSIS — H578 Other specified disorders of eye and adnexa: Secondary | ICD-10-CM | POA: Diagnosis not present

## 2016-03-12 MED ORDER — LISINOPRIL 2.5 MG PO TABS: 2.5000 mg | ORAL_TABLET | Freq: Every day | ORAL | 1 refills | Status: DC

## 2016-03-12 MED ORDER — ATORVASTATIN CALCIUM 20 MG PO TABS: 20.0000 mg | ORAL_TABLET | Freq: Every day | ORAL | 1 refills | Status: DC

## 2016-03-12 NOTE — Progress Notes (Signed)
BP 128/77   Pulse 76   Temp 98.2 F (36.8 C)   Ht 5\' 6"  (1.676 m)   Wt 192 lb (87.1 kg)   SpO2 94%   BMI 30.99 kg/m    Subjective:    Patient ID: Colton Brown, male    DOB: Jul 08, 1955, 60 y.o.   MRN: [id number]  HPI: Colton Brown is a 60 y.o. male  Chief Complaint  Patient presents with  . Annual Exam   Since last seen, got his gall bladder removed.    Hypertension Using medications without difficulty Average home BPs   No problems or lightheadedness No chest pain with exertion or shortness of breath No Edema   Hyperlipidemia Using medications without problems: No Muscle aches  Diet compliance: Exercise:  Social History   Social History  . Marital status: Divorced    Spouse name: N/A  . Number of children: N/A  . Years of education: N/A   Occupational History  . Not on file.   Social History Main Topics  . Smoking status: Never Smoker  . Smokeless tobacco: Never Used  . Alcohol use No  . Drug use: No  . Sexual activity: Yes   Other Topics Concern  . Not on file   Social History Narrative  . No narrative on file   Family History  Problem Relation Age of Onset  . Asthma Mother   . Stroke Mother   . Migraines Mother   . Thyroid disease Mother   . Diabetes Mother   . Heart disease Father   . Hyperlipidemia Father   . Hypertension Father   . Stroke Father   . Hyperlipidemia Brother   . Hypertension Brother   . Diabetes Maternal Grandmother   . Diabetes Maternal Grandfather   . Heart disease Paternal Grandmother    Past Medical History:  Diagnosis Date  . 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 GERD (gastroesophageal reflux disease)    DUE TO GALLBLADDER-NO MEDS  . Hyperlipidemia   . Lumbago   . Psoriasis    Past Surgical History:  Procedure Laterality Date  . ANKLE SURGERY Left 1989  . APPENDECTOMY  1979  . CHOLECYSTECTOMY N/A 11/11/2015   Procedure: LAPAROSCOPIC CHOLECYSTECTOMY WITH INTRAOPERATIVE CHOLANGIOGRAM;   Surgeon: Hubbard Robinson, MD;  Location: [facility] ORS;  Service: General;  Laterality: N/A;  . LUMBAR DISC SURGERY     L4-L5  . WISDOM TOOTH EXTRACTION       Relevant past medical, surgical, family and social history reviewed and updated as indicated. Interim medical history since our last visit reviewed. Allergies and medications reviewed and updated.  Review of Systems  Eyes: Positive for redness. Negative for photophobia and visual disturbance.       Left eye red for a month following FOB that seemed to "bounce out."  No pain or visual problems.      Per HPI unless specifically indicated above     Objective:    BP 128/77   Pulse 76   Temp 98.2 F (36.8 C)   Ht 5\' 6"  (1.676 m)   Wt 192 lb (87.1 kg)   SpO2 94%   BMI 30.99 kg/m   Wt Readings from Last 3 Encounters:  03/12/16 192 lb (87.1 kg)  12/01/15 190 lb (86.2 kg)  11/11/15 193 lb (87.5 kg)    Physical Exam  Constitutional: He is oriented to person, place, and time. He appears well-developed and well-nourished.  HENT:  Head: Normocephalic.  Right Ear: Tympanic membrane, external ear and ear canal normal.  Left Ear: Tympanic membrane, external ear and ear canal normal.  Mouth/Throat: Uvula is midline, oropharynx is clear and moist and mucous membranes are normal.  Eyes: Pupils are equal, round, and reactive to light. Left conjunctiva is injected. Left eye exhibits normal extraocular motion and no nystagmus.  Cardiovascular: Normal rate, regular rhythm and normal heart sounds.  Exam reveals no gallop and no friction rub.   No murmur heard. Pulmonary/Chest: Effort normal and breath sounds normal. No respiratory distress.  Abdominal: Soft. Bowel sounds are normal. He exhibits no distension. There is no tenderness.  Musculoskeletal: Normal range of motion.  Neurological: He is alert and oriented to person, place, and time. He has normal reflexes.  Skin: Skin is warm and dry.  Psychiatric: He has a normal mood and  affect. His behavior is normal. Judgment and thought content normal.     Refused rectal exam Assessment & Plan:   Problem List Items Addressed This Visit      Unprioritized   CKD (chronic kidney disease)    Taking Lisinopril      Hyperlipidemia - Primary   Relevant Medications   atorvastatin (LIPITOR) 20 MG tablet   lisinopril (PRINIVIL,ZESTRIL) 2.5 MG tablet   RESOLVED: Hypertension   Relevant Medications   atorvastatin (LIPITOR) 20 MG tablet   lisinopril (PRINIVIL,ZESTRIL) 2.5 MG tablet    Other Visit Diagnoses    Routine general medical examination at a health care facility       Relevant Orders   CBC with Differential/Platelet   Comprehensive metabolic panel   Lipid Panel w/o Chol/HDL Ratio   TSH   PSA   HIV antibody   Hepatitis C antibody   Encounter for immunization       Relevant Orders   Flu Vaccine QUAD 36+ mos IM (Completed)   Redness of eye, left       Refer to Opthamology    +   Follow up plan: Return in about 6 months (around 09/10/2016), or if symptoms worsen or fail to improve.

## 2016-03-12 NOTE — Assessment & Plan Note (Signed)
Taking Lisinopril

## 2016-03-13 LAB — CBC WITH DIFFERENTIAL/PLATELET
BASOS: 1 %
Basophils Absolute: 0.1 10*3/uL (ref 0.0–0.2)
EOS (ABSOLUTE): 0.2 10*3/uL (ref 0.0–0.4)
EOS: 4 %
HEMATOCRIT: 44 % (ref 37.5–51.0)
HEMOGLOBIN: 15.9 g/dL (ref 12.6–17.7)
Immature Grans (Abs): 0 10*3/uL (ref 0.0–0.1)
Immature Granulocytes: 0 %
LYMPHS ABS: 1.4 10*3/uL (ref 0.7–3.1)
Lymphs: 20 %
MCH: 30.8 pg (ref 26.6–33.0)
MCHC: 36.1 g/dL — AB (ref 31.5–35.7)
MCV: 85 fL (ref 79–97)
MONOCYTES: 8 %
Monocytes Absolute: 0.6 10*3/uL (ref 0.1–0.9)
Neutrophils Absolute: 4.5 10*3/uL (ref 1.4–7.0)
Neutrophils: 67 %
Platelets: 180 10*3/uL (ref 150–379)
RBC: 5.16 x10E6/uL (ref 4.14–5.80)
RDW: 13.7 % (ref 12.3–15.4)
WBC: 6.7 10*3/uL (ref 3.4–10.8)

## 2016-03-13 LAB — COMPREHENSIVE METABOLIC PANEL
ALBUMIN: 4.4 g/dL (ref 3.5–5.5)
ALK PHOS: 72 IU/L (ref 39–117)
ALT: 38 IU/L (ref 0–44)
AST: 21 IU/L (ref 0–40)
Albumin/Globulin Ratio: 1.8 (ref 1.2–2.2)
BILIRUBIN TOTAL: 1.3 mg/dL — AB (ref 0.0–1.2)
BUN / CREAT RATIO: 12 (ref 9–20)
BUN: 10 mg/dL (ref 6–24)
CHLORIDE: 102 mmol/L (ref 96–106)
CO2: 22 mmol/L (ref 18–29)
Calcium: 9.4 mg/dL (ref 8.7–10.2)
Creatinine, Ser: 0.86 mg/dL (ref 0.76–1.27)
GFR calc non Af Amer: 96 mL/min/{1.73_m2} (ref >59)
GFR, EST AFRICAN AMERICAN: 110 mL/min/{1.73_m2} (ref >59)
GLOBULIN, TOTAL: 2.5 g/dL (ref 1.5–4.5)
Glucose: 98 mg/dL (ref 65–99)
Potassium: 4.2 mmol/L (ref 3.5–5.2)
SODIUM: 142 mmol/L (ref 134–144)
TOTAL PROTEIN: 6.9 g/dL (ref 6.0–8.5)

## 2016-03-13 LAB — PSA: PROSTATE SPECIFIC AG, SERUM: 1.3 ng/mL (ref 0.0–4.0)

## 2016-03-13 LAB — LIPID PANEL W/O CHOL/HDL RATIO
CHOLESTEROL TOTAL: 145 mg/dL (ref 100–199)
HDL: 47 mg/dL (ref >39)
LDL CALC: 74 mg/dL (ref 0–99)
Triglycerides: 122 mg/dL (ref 0–149)
VLDL Cholesterol Cal: 24 mg/dL (ref 5–40)

## 2016-03-13 LAB — TSH: TSH: 1.59 u[IU]/mL (ref 0.450–4.500)

## 2016-03-13 LAB — HIV ANTIBODY (ROUTINE TESTING W REFLEX): HIV SCREEN 4TH GENERATION: NONREACTIVE

## 2016-03-13 LAB — HEPATITIS C ANTIBODY

## 2016-03-15 ENCOUNTER — Encounter: Payer: Self-pay | Admitting: Unknown Physician Specialty

## 2016-03-15 NOTE — Progress Notes (Signed)
There were no vitals taken for this visit.   Subjective:    Patient ID: Colton Brown, male    DOB: 1955-09-05, 60 y.o.   MRN: [id number]  HPI: Colton Brown is a 60 y.o. male  No chief complaint on file.   Note started in error  Relevant past medical, surgical, family and social history reviewed and updated as indicated. Interim medical history since our last visit reviewed. Allergies and medications reviewed and updated.  Review of Systems  Per HPI unless specifically indicated above     Objective:    There were no vitals taken for this visit.  Wt Readings from Last 3 Encounters:  03/12/16 192 lb (87.1 kg)  12/01/15 190 lb (86.2 kg)  11/11/15 193 lb (87.5 kg)    Physical Exam  Results for orders placed or performed in visit on 03/12/16  CBC with Differential/Platelet  Result Value Ref Range   WBC 6.7 3.4 - 10.8 x10E3/uL   RBC 5.16 4.14 - 5.80 x10E6/uL   Hemoglobin 15.9 12.6 - 17.7 g/dL   Hematocrit 44.0 37.5 - 51.0 %   MCV 85 79 - 97 fL   MCH 30.8 26.6 - 33.0 pg   MCHC 36.1 (H) 31.5 - 35.7 g/dL   RDW 13.7 12.3 - 15.4 %   Platelets 180 150 - 379 x10E3/uL   Neutrophils 67 Not Estab. %   Lymphs 20 Not Estab. %   Monocytes 8 Not Estab. %   Eos 4 Not Estab. %   Basos 1 Not Estab. %   Neutrophils Absolute 4.5 1.4 - 7.0 x10E3/uL   Lymphocytes Absolute 1.4 0.7 - 3.1 x10E3/uL   Monocytes Absolute 0.6 0.1 - 0.9 x10E3/uL   EOS (ABSOLUTE) 0.2 0.0 - 0.4 x10E3/uL   Basophils Absolute 0.1 0.0 - 0.2 x10E3/uL   Immature Granulocytes 0 Not Estab. %   Immature Grans (Abs) 0.0 0.0 - 0.1 x10E3/uL  Comprehensive metabolic panel  Result Value Ref Range   Glucose 98 65 - 99 mg/dL   BUN 10 6 - 24 mg/dL   Creatinine, Ser 0.86 0.76 - 1.27 mg/dL   GFR calc non Af Amer 96 >59 mL/min/1.73   GFR calc Af Amer 110 >59 mL/min/1.73   BUN/Creatinine Ratio 12 9 - 20   Sodium 142 134 - 144 mmol/L   Potassium 4.2 3.5 - 5.2 mmol/L   Chloride 102 96 - 106 mmol/L   CO2 22 18 - 29 mmol/L   Calcium 9.4 8.7 - 10.2 mg/dL   Total Protein 6.9 6.0 - 8.5 g/dL   Albumin 4.4 3.5 - 5.5 g/dL   Globulin, Total 2.5 1.5 - 4.5 g/dL   Albumin/Globulin Ratio 1.8 1.2 - 2.2   Bilirubin Total 1.3 (H) 0.0 - 1.2 mg/dL   Alkaline Phosphatase 72 39 - 117 IU/L   AST 21 0 - 40 IU/L   ALT 38 0 - 44 IU/L  Lipid Panel w/o Chol/HDL Ratio  Result Value Ref Range   Cholesterol, Total 145 100 - 199 mg/dL   Triglycerides 122 0 - 149 mg/dL   HDL 47 >39 mg/dL   VLDL Cholesterol Cal 24 5 - 40 mg/dL   LDL Calculated 74 0 - 99 mg/dL  TSH  Result Value Ref Range   TSH 1.590 0.450 - 4.500 uIU/mL  PSA  Result Value Ref Range   Prostate Specific Ag, Serum 1.3 0.0 - 4.0 ng/mL  HIV antibody  Result Value Ref Range   HIV Screen 4th Generation wRfx Non  Reactive Non Reactive  Hepatitis C antibody  Result Value Ref Range   Hep C Virus Ab <0.1 0.0 - 0.9 s/co ratio      Assessment & Plan:   Problem List Items Addressed This Visit    None    Visit Diagnoses   None.      Follow up plan: No Follow-up on file.

## 2016-09-11 ENCOUNTER — Encounter: Payer: Self-pay | Admitting: *Deleted

## 2016-09-11 ENCOUNTER — Ambulatory Visit
Admission: EM | Admit: 2016-09-11 | Discharge: 2016-09-11 | Disposition: A | Discharge status: Home / Self Care | Payer: BC Managed Care – PPO | Attending: Family Medicine | Admitting: Family Medicine

## 2016-09-11 DIAGNOSIS — W57XXXA Bitten or stung by nonvenomous insect and other nonvenomous arthropods, initial encounter: Secondary | ICD-10-CM | POA: Diagnosis not present

## 2016-09-11 DIAGNOSIS — S70261A Insect bite (nonvenomous), right hip, initial encounter: Secondary | ICD-10-CM

## 2016-09-11 DIAGNOSIS — A6920 Lyme disease, unspecified: Secondary | ICD-10-CM

## 2016-09-11 MED ORDER — DOXYCYCLINE HYCLATE 100 MG PO TABS: 100.0000 mg | ORAL_TABLET | Freq: Two times a day (BID) | ORAL | 0 refills | Status: DC

## 2016-09-11 NOTE — ED Triage Notes (Signed)
Patient removed tick from his right hip on 09/01/16. The site is now red and swollen. No drainage reported.

## 2016-09-11 NOTE — ED Provider Notes (Signed)
[facility]    CSN: [id number] Arrival date & time: 09/11/16  1109     History   Chief Complaint Chief Complaint  Patient presents with  . Insect Bite    HPI Colton Brown is a 61 y.o. male.   61 yo male with a c/o a rash on his right hip for the past 2 days. States outer redness of the rash seems bigger, worse today. Denies any drainage. States site of rash is at the same site where 10 days ago he pulled a tick that had been embedded. States he thinks tick was embedded for 24 hours at the most and he thinks he was able to get it out completely/intact. He denies any other symptoms. Denies any fevers, chills, flu like symptoms, bodyaches or neurologic symptoms. Only the new rash.    The history is provided by the patient.    Past Medical History:  Diagnosis Date  . 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 GERD (gastroesophageal reflux disease)    DUE TO GALLBLADDER-NO MEDS  . Hyperlipidemia   . Lumbago   . Psoriasis     Patient Active Problem List   Diagnosis Date Noted  . Chronic cholecystitis without calculus   . Biliary dyskinesia 10/17/2015  . Hyperlipidemia 02/24/2015  . Psoriasis 02/24/2015  . Lumbago 02/24/2015  . CKD (chronic kidney disease) 02/24/2015    Past Surgical History:  Procedure Laterality Date  . ANKLE SURGERY Left 1989  . APPENDECTOMY  1979  . CHOLECYSTECTOMY N/A 11/11/2015   Procedure: LAPAROSCOPIC CHOLECYSTECTOMY WITH INTRAOPERATIVE CHOLANGIOGRAM;  Surgeon: Hubbard Robinson, MD;  Location: [facility] ORS;  Service: General;  Laterality: N/A;  . LUMBAR DISC SURGERY     L4-L5  . WISDOM TOOTH EXTRACTION         Home Medications    Prior to Admission medications   Medication Sig Start Date End Date Taking? Authorizing Provider  aspirin 81 MG tablet Take 81 mg by mouth daily.   Yes Historical Provider, MD  atorvastatin (LIPITOR) 20 MG tablet Take 1 tablet (20 mg total) by mouth daily. 03/12/16  Yes Kathrine Haddock, NP    clobetasol (TEMOVATE) 0.05 % external solution Apply 1 application topically 2 (two) times daily as needed. 08/26/15  Yes Kathrine Haddock, NP  lisinopril (PRINIVIL,ZESTRIL) 2.5 MG tablet Take 1 tablet (2.5 mg total) by mouth daily. 03/12/16  Yes Kathrine Haddock, NP  Turmeric 500 MG CAPS Take 1 capsule by mouth daily.   Yes Historical Provider, MD  doxycycline (VIBRA-TABS) 100 MG tablet Take 1 tablet (100 mg total) by mouth 2 (two) times daily. 09/11/16   Norval Gable, MD    Family History Family History  Problem Relation Age of Onset  . Asthma Mother   . Stroke Mother   . Migraines Mother   . Thyroid disease Mother   . Diabetes Mother   . Heart disease Father   . Hyperlipidemia Father   . Hypertension Father   . Stroke Father   . Hyperlipidemia Brother   . Hypertension Brother   . Diabetes Maternal Grandmother   . Diabetes Maternal Grandfather   . Heart disease Paternal Grandmother     Social History Social History  Substance Use Topics  . Smoking status: Never Smoker  . Smokeless tobacco: Never Used  . Alcohol use No     Allergies   Patient has no known allergies.   Review of Systems Review of Systems   Physical  Exam Triage Vital Signs ED Triage Vitals  Enc Vitals Group     BP 09/11/16 1123 (!) 141/90     Pulse Rate 09/11/16 1123 89     Resp 09/11/16 1123 16     Temp 09/11/16 1123 98 F (36.7 C)     Temp Source 09/11/16 1123 Oral     SpO2 09/11/16 1123 100 %     Weight 09/11/16 1124 192 lb (87.1 kg)     Height 09/11/16 1124 5\' 7"  (1.702 m)     Head Circumference --      Peak Flow --      Pain Score 09/11/16 1126 0     Pain Loc --      Pain Edu? --      Excl. in [place]? --    No data found.   Updated Vital Signs BP (!) 141/90 (BP Location: Left Arm)   Pulse 89   Temp 98 F (36.7 C) (Oral)   Resp 16   Ht 5\' 7"  (1.702 m)   Wt 192 lb (87.1 kg)   SpO2 100%   BMI 30.07 kg/m   Visual Acuity Right Eye Distance:   Left Eye Distance:   Bilateral  Distance:    Right Eye Near:   Left Eye Near:    Bilateral Near:     Physical Exam  Constitutional: He appears well-developed and well-nourished. No distress.  Skin: Rash noted. He is not diaphoretic.  3 x 4.5cm erythematous, oval shaped rash with central clearing on right hip skin; no drainage  Nursing note and vitals reviewed.    UC Treatments / Results  Labs (all labs ordered are listed, but only abnormal results are displayed) Labs Reviewed - No data to display  EKG  EKG Interpretation None       Radiology No results found.  Procedures Procedures (including critical care time)  Medications Ordered in UC Medications - No data to display   Initial Impression / Assessment and Plan / UC Course  I have reviewed the triage vital signs and the nursing notes.  Pertinent labs & imaging results that were available during my care of the patient were reviewed by me and considered in my medical decision making (see chart for details).       Final Clinical Impressions(s) / UC Diagnoses   Final diagnoses:  Tick bite, initial encounter  Erythema migrans (Lyme disease)    New Prescriptions Discharge Medication List as of 09/11/2016 11:46 AM    START taking these medications   Details  doxycycline (VIBRA-TABS) 100 MG tablet Take 1 tablet (100 mg total) by mouth 2 (two) times daily., Starting Sat 09/11/2016, Normal       1. diagnosis reviewed with patient 2. rx as per orders above; reviewed possible side effects, interactions, risks and benefits  3. Recommend keep appointment for follow up with PCP next week and recheck (patient states he has an appointment scheduled with his PCP in 3 or 4 days) 4. Follow-up here prn if symptoms worsen or don't improve   Norval Gable, MD 09/11/16 1334

## 2016-09-14 ENCOUNTER — Encounter: Payer: Self-pay | Admitting: Unknown Physician Specialty

## 2016-09-14 ENCOUNTER — Ambulatory Visit (INDEPENDENT_AMBULATORY_CARE_PROVIDER_SITE_OTHER): Payer: BC Managed Care – PPO | Admitting: Unknown Physician Specialty

## 2016-09-14 VITALS — BP 129/82 | HR 84 | Temp 98.2°F | Ht 66.6 in | Wt 197.0 lb

## 2016-09-14 DIAGNOSIS — Z0000 Encounter for general adult medical examination without abnormal findings: Secondary | ICD-10-CM

## 2016-09-14 DIAGNOSIS — E7800 Pure hypercholesterolemia, unspecified: Secondary | ICD-10-CM | POA: Diagnosis not present

## 2016-09-14 DIAGNOSIS — I10 Essential (primary) hypertension: Secondary | ICD-10-CM

## 2016-09-14 DIAGNOSIS — A6920 Lyme disease, unspecified: Secondary | ICD-10-CM | POA: Diagnosis not present

## 2016-09-14 MED ORDER — LISINOPRIL 2.5 MG PO TABS: 2.5000 mg | ORAL_TABLET | Freq: Every day | ORAL | 1 refills | Status: DC

## 2016-09-14 MED ORDER — CLOBETASOL PROPIONATE 0.05 % EX SOLN: 1.0000 "application " | Freq: Two times a day (BID) | CUTANEOUS | 12 refills | Status: DC | PRN

## 2016-09-14 MED ORDER — ATORVASTATIN CALCIUM 20 MG PO TABS: 20.0000 mg | ORAL_TABLET | Freq: Every day | ORAL | 1 refills | Status: DC

## 2016-09-14 NOTE — Patient Instructions (Addendum)
 Health Maintenance, Male A healthy lifestyle and preventive care is important for your health and wellness. Ask your health care provider about what schedule of regular examinations is right for you. What should I know about weight and diet?  Eat a Healthy Diet  Eat plenty of vegetables, fruits, whole grains, low-fat dairy products, and lean protein.  Do not eat a lot of foods high in solid fats, added sugars, or salt. Maintain a Healthy Weight  Regular exercise can help you achieve or maintain a healthy weight. You should:  Do at least 150 minutes of exercise each week. The exercise should increase your heart rate and make you sweat (moderate-intensity exercise).  Do strength-training exercises at least twice a week. Watch Your Levels of Cholesterol and Blood Lipids  Have your blood tested for lipids and cholesterol every 5 years starting at 60 years of age. If you are at high risk for heart disease, you should start having your blood tested when you are 60 years old. You may need to have your cholesterol levels checked more often if:  Your lipid or cholesterol levels are high.  You are older than 60 years of age.  You are at high risk for heart disease. What should I know about cancer screening? Many types of cancers can be detected early and may often be prevented. Lung Cancer  You should be screened every year for lung cancer if:  You are a current smoker who has smoked for at least 30 years.  You are a former smoker who has quit within the past 15 years.  Talk to your health care provider about your screening options, when you should start screening, and how often you should be screened. Colorectal Cancer  Routine colorectal cancer screening usually begins at 60 years of age and should be repeated every 5-10 years until you are 60 years old. You may need to be screened more often if early forms of precancerous polyps or small growths are found. Your health care provider  may recommend screening at an earlier age if you have risk factors for colon cancer.  Your health care provider may recommend using home test kits to check for hidden blood in the stool.  A small camera at the end of a tube can be used to examine your colon (sigmoidoscopy or colonoscopy). This checks for the earliest forms of colorectal cancer. Prostate and Testicular Cancer  Depending on your age and overall health, your health care provider may do certain tests to screen for prostate and testicular cancer.  Talk to your health care provider about any symptoms or concerns you have about testicular or prostate cancer. Skin Cancer  Check your skin from head to toe regularly.  Tell your health care provider about any new moles or changes in moles, especially if:  There is a change in a mole's size, shape, or color.  You have a mole that is larger than a pencil eraser.  Always use sunscreen. Apply sunscreen liberally and repeat throughout the day.  Protect yourself by wearing long sleeves, pants, a wide-brimmed hat, and sunglasses when outside. What should I know about heart disease, diabetes, and high blood pressure?  If you are 18-39 years of age, have your blood pressure checked every 3-5 years. If you are 40 years of age or older, have your blood pressure checked every year. You should have your blood pressure measured twice-once when you are at a hospital or clinic, and once when you are not at   a hospital or clinic. Record the average of the two measurements. To check your blood pressure when you are not at a hospital or clinic, you can use:  An automated blood pressure machine at a pharmacy.  A home blood pressure monitor.  Talk to your health care provider about your target blood pressure.  If you are between 45-79 years old, ask your health care provider if you should take aspirin to prevent heart disease.  Have regular diabetes screenings by checking your fasting blood sugar  level.  If you are at a normal weight and have a low risk for diabetes, have this test once every three years after the age of 45.  If you are overweight and have a high risk for diabetes, consider being tested at a younger age or more often.  A one-time screening for abdominal aortic aneurysm (AAA) by ultrasound is recommended for men aged 65-75 years who are current or former smokers. What should I know about preventing infection? Hepatitis B  If you have a higher risk for hepatitis B, you should be screened for this virus. Talk with your health care provider to find out if you are at risk for hepatitis B infection. Hepatitis C  Blood testing is recommended for:  Everyone born from 1945 through 1965.  Anyone with known risk factors for hepatitis C. Sexually Transmitted Diseases (STDs)  You should be screened each year for STDs including gonorrhea and chlamydia if:  You are sexually active and are younger than 60 years of age.  You are older than 60 years of age and your health care provider tells you that you are at risk for this type of infection.  Your sexual activity has changed since you were last screened and you are at an increased risk for chlamydia or gonorrhea. Ask your health care provider if you are at risk.  Talk with your health care provider about whether you are at high risk of being infected with HIV. Your health care provider may recommend a prescription medicine to help prevent HIV infection. What else can I do?  Schedule regular health, dental, and eye exams.  Stay current with your vaccines (immunizations).  Do not use any tobacco products, such as cigarettes, chewing tobacco, and e-cigarettes. If you need help quitting, ask your health care provider.  Limit alcohol intake to no more than 2 drinks per day. One drink equals 12 ounces of beer, 5 ounces of wine, or 1 ounces of hard liquor.  Do not use street drugs.  Do not share needles.  Ask your health  care provider for help if you need support or information about quitting drugs.  Tell your health care provider if you often feel depressed.  Tell your health care provider if you have ever been abused or do not feel safe at home. This information is not intended to replace advice given to you by your health care provider. Make sure you discuss any questions you have with your health care provider. Document Released: 11/06/2007 Document Revised: 01/07/2016 Document Reviewed: 02/11/2015 Elsevier Interactive Patient Education  2017 Elsevier Inc.  

## 2016-09-14 NOTE — Assessment & Plan Note (Signed)
Stable, continue present medications.   

## 2016-09-14 NOTE — Progress Notes (Signed)
BP 129/82   Pulse 84   Temp 98.2 F (36.8 C)   Ht 5' 6.6" (1.692 m)   Wt 197 lb (89.4 kg)   SpO2 96%   BMI 31.23 kg/m    Subjective:    Patient ID: Colton Brown, male    DOB: December 07, 1955, 61 y.o.   MRN: [id number]  HPI: Colton Brown is a 61 y.o. male  Chief Complaint  Patient presents with  . Annual Exam   Erythema migrans Pt with early Lyme disease left thigh.  Treated at urgent care 3 days ago.  Pt taking 10 days of Doxycycline.    Hypertension Using medications without difficulty Average home BPs Not checking  No problems or lightheadedness No chest pain with exertion or shortness of breath No Edema  Hyperlipidemia Using medications without problems: No Muscle aches  Diet compliance/Exercise: Exercise dropped down.  States he tries to watch what he eats.  Social History   Social History  . Marital status: Divorced    Spouse name: N/A  . Number of children: N/A  . Years of education: N/A   Occupational History  . Not on file.   Social History Main Topics  . Smoking status: Never Smoker  . Smokeless tobacco: Never Used  . Alcohol use No  . Drug use: No  . Sexual activity: Yes   Other Topics Concern  . Not on file   Social History Narrative  . No narrative on file   Family History  Problem Relation Age of Onset  . Asthma Mother   . Stroke Mother   . Migraines Mother   . Thyroid disease Mother   . Diabetes Mother   . Heart disease Father   . Hyperlipidemia Father   . Hypertension Father   . Stroke Father   . Hyperlipidemia Brother   . Hypertension Brother   . Diabetes Maternal Grandmother   . Diabetes Maternal Grandfather   . Heart disease Paternal Grandmother    Past Medical History:  Diagnosis Date  . 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 GERD (gastroesophageal reflux disease)    DUE TO GALLBLADDER-NO MEDS  . Hyperlipidemia   . Lumbago   . Psoriasis    Past Surgical History:  Procedure Laterality Date  . ANKLE  SURGERY Left 1989  . APPENDECTOMY  1979  . CHOLECYSTECTOMY N/A 11/11/2015   Procedure: LAPAROSCOPIC CHOLECYSTECTOMY WITH INTRAOPERATIVE CHOLANGIOGRAM;  Surgeon: Hubbard Robinson, MD;  Location: [facility] ORS;  Service: General;  Laterality: N/A;  . LUMBAR DISC SURGERY     L4-L5  . WISDOM TOOTH EXTRACTION      Relevant past medical, surgical, family and social history reviewed and updated as indicated. Interim medical history since our last visit reviewed. Allergies and medications reviewed and updated.  Review of Systems  Constitutional: Negative.   HENT: Negative.   Eyes: Negative.   Respiratory: Negative.   Cardiovascular: Negative.   Gastrointestinal: Negative.   Endocrine: Negative.   Genitourinary: Negative.   Skin: Negative.   Allergic/Immunologic: Negative.   Neurological: Negative.   Hematological: Negative.   Psychiatric/Behavioral: Negative.     Per HPI unless specifically indicated above     Objective:    BP 129/82   Pulse 84   Temp 98.2 F (36.8 C)   Ht 5' 6.6" (1.692 m)   Wt 197 lb (89.4 kg)   SpO2 96%   BMI 31.23 kg/m   Wt Readings from Last 3 Encounters:  09/14/16 197 lb (89.4 kg)  09/11/16 192 lb (87.1 kg)  03/12/16 192 lb (87.1 kg)    Physical Exam  Constitutional: He is oriented to person, place, and time. He appears well-developed and well-nourished.  HENT:  Head: Normocephalic.  Right Ear: Tympanic membrane, external ear and ear canal normal.  Left Ear: Tympanic membrane, external ear and ear canal normal.  Mouth/Throat: Uvula is midline, oropharynx is clear and moist and mucous membranes are normal.  Eyes: Pupils are equal, round, and reactive to light.  Cardiovascular: Normal rate, regular rhythm and normal heart sounds.  Exam reveals no gallop and no friction rub.   No murmur heard. Pulmonary/Chest: Effort normal and breath sounds normal. No respiratory distress.  Abdominal: Soft. Bowel sounds are normal. He exhibits no distension. There is  no tenderness.  Musculoskeletal: Normal range of motion.  Neurological: He is alert and oriented to person, place, and time. He has normal reflexes.  Skin: Skin is warm and dry.  Psychiatric: He has a normal mood and affect. His behavior is normal. Judgment and thought content normal.       Assessment & Plan:   Problem List Items Addressed This Visit      Unprioritized   Essential hypertension, benign    Stable, continue present medications.        Relevant Medications   atorvastatin (LIPITOR) 20 MG tablet   lisinopril (PRINIVIL,ZESTRIL) 2.5 MG tablet   Other Relevant Orders   Comprehensive metabolic panel   Hyperlipidemia    Check Lipid panel      Relevant Medications   atorvastatin (LIPITOR) 20 MG tablet   lisinopril (PRINIVIL,ZESTRIL) 2.5 MG tablet   Other Relevant Orders   Lipid Panel w/o Chol/HDL Ratio    Other Visit Diagnoses    Erythema migrans (Lyme disease)    -  Primary   On appropriate treatment   Annual physical exam       Relevant Orders   Comprehensive metabolic panel   CBC with Differential/Platelet   Lipid Panel w/o Chol/HDL Ratio   Ambulatory referral to Gastroenterology       Follow up plan: Return in about 6 months (around 03/16/2017).

## 2016-09-14 NOTE — Assessment & Plan Note (Signed)
Check Lipid panel 

## 2016-09-15 ENCOUNTER — Encounter: Payer: Self-pay | Admitting: Unknown Physician Specialty

## 2016-09-15 LAB — COMPREHENSIVE METABOLIC PANEL
ALBUMIN: 4.5 g/dL (ref 3.5–5.5)
ALT: 46 IU/L — ABNORMAL HIGH (ref 0–44)
AST: 26 IU/L (ref 0–40)
Albumin/Globulin Ratio: 1.8 (ref 1.2–2.2)
Alkaline Phosphatase: 74 IU/L (ref 39–117)
BUN / CREAT RATIO: 16 (ref 9–20)
BUN: 12 mg/dL (ref 6–24)
Bilirubin Total: 1.1 mg/dL (ref 0.0–1.2)
CALCIUM: 9.4 mg/dL (ref 8.7–10.2)
CHLORIDE: 101 mmol/L (ref 96–106)
CO2: 21 mmol/L (ref 18–29)
Creatinine, Ser: 0.77 mg/dL (ref 0.76–1.27)
GFR calc Af Amer: 115 mL/min/{1.73_m2} (ref >59)
GFR, EST NON AFRICAN AMERICAN: 99 mL/min/{1.73_m2} (ref >59)
GLOBULIN, TOTAL: 2.5 g/dL (ref 1.5–4.5)
Glucose: 102 mg/dL — ABNORMAL HIGH (ref 65–99)
POTASSIUM: 4.2 mmol/L (ref 3.5–5.2)
SODIUM: 140 mmol/L (ref 134–144)
TOTAL PROTEIN: 7 g/dL (ref 6.0–8.5)

## 2016-09-15 LAB — CBC WITH DIFFERENTIAL/PLATELET
Basophils Absolute: 0 10*3/uL (ref 0.0–0.2)
Basos: 1 %
EOS (ABSOLUTE): 0.2 10*3/uL (ref 0.0–0.4)
Eos: 3 %
Hematocrit: 46.9 % (ref 37.5–51.0)
Hemoglobin: 16.6 g/dL (ref 13.0–17.7)
IMMATURE GRANULOCYTES: 0 %
Immature Grans (Abs): 0 10*3/uL (ref 0.0–0.1)
LYMPHS ABS: 1.6 10*3/uL (ref 0.7–3.1)
Lymphs: 20 %
MCH: 30.2 pg (ref 26.6–33.0)
MCHC: 35.4 g/dL (ref 31.5–35.7)
MCV: 85 fL (ref 79–97)
MONOS ABS: 0.4 10*3/uL (ref 0.1–0.9)
Monocytes: 6 %
NEUTROS PCT: 70 %
Neutrophils Absolute: 5.5 10*3/uL (ref 1.4–7.0)
PLATELETS: 171 10*3/uL (ref 150–379)
RBC: 5.5 x10E6/uL (ref 4.14–5.80)
RDW: 14.3 % (ref 12.3–15.4)
WBC: 7.7 10*3/uL (ref 3.4–10.8)

## 2016-09-15 LAB — LIPID PANEL W/O CHOL/HDL RATIO
Cholesterol, Total: 159 mg/dL (ref 100–199)
HDL: 47 mg/dL (ref >39)
LDL Calculated: 81 mg/dL (ref 0–99)
TRIGLYCERIDES: 155 mg/dL — AB (ref 0–149)
VLDL Cholesterol Cal: 31 mg/dL (ref 5–40)

## 2016-09-21 ENCOUNTER — Other Ambulatory Visit: Payer: Self-pay

## 2016-09-21 ENCOUNTER — Telehealth: Payer: Self-pay

## 2016-09-21 DIAGNOSIS — Z1211 Encounter for screening for malignant neoplasm of colon: Secondary | ICD-10-CM

## 2016-09-21 NOTE — Telephone Encounter (Signed)
Gastroenterology Pre-Procedure Review  Request Date: 11/02/16 Requesting Physician: Dr. Allen Norris  PATIENT REVIEW QUESTIONS: The patient responded to the following health history questions as indicated:    1. Are you having any GI issues? no 2. Do you have a personal history of Polyps? no 3. Do you have a family history of Colon Cancer or Polyps? no 4. Diabetes Mellitus? no 5. Joint replacements in the past 12 months?no 6. Major health problems in the past 3 months?yes (Just finished 10 day treatment Doxycycline for Lyme Disease) 7. Any artificial heart valves, MVP, or defibrillator?no    MEDICATIONS & ALLERGIES:    Patient reports the following regarding taking any anticoagulation/antiplatelet therapy:   Plavix, Coumadin, Eliquis, Xarelto, Lovenox, Pradaxa, Brilinta, or Effient? no Aspirin? yes (81 mg daily)  Patient confirms/reports the following medications:  Current Outpatient Prescriptions  Medication Sig Dispense Refill  . aspirin 81 MG tablet Take 81 mg by mouth daily.    Marland Kitchen atorvastatin (LIPITOR) 20 MG tablet Take 1 tablet (20 mg total) by mouth daily. 90 tablet 1  . clobetasol (TEMOVATE) 0.05 % external solution Apply 1 application topically 2 (two) times daily as needed. 50 mL 12  . doxycycline (VIBRA-TABS) 100 MG tablet Take 1 tablet (100 mg total) by mouth 2 (two) times daily. 20 tablet 0  . lisinopril (PRINIVIL,ZESTRIL) 2.5 MG tablet Take 1 tablet (2.5 mg total) by mouth daily. 90 tablet 1  . Turmeric 500 MG CAPS Take 1 capsule by mouth daily.     No current facility-administered medications for this visit.     Patient confirms/reports the following allergies:  No Known Allergies  No orders of the defined types were placed in this encounter.   AUTHORIZATION INFORMATION Primary Insurance: 1D#: Group #:  Secondary Insurance: 1D#: Group #:  SCHEDULE INFORMATION: Date:  Time: Location:

## 2016-09-22 ENCOUNTER — Telehealth: Payer: Self-pay | Admitting: Gastroenterology

## 2016-09-22 NOTE — Telephone Encounter (Signed)
09/22/16 Spoke with Holley Bouche at [facility] and NO prior auth required for Colonoscopy [phone] / Z12.11.

## 2016-11-02 ENCOUNTER — Encounter
Admission: RE | Disposition: A | Discharge status: Home / Self Care | Payer: Self-pay | Source: Ambulatory Visit | Attending: Gastroenterology

## 2016-11-02 ENCOUNTER — Ambulatory Visit
Admission: RE | Admit: 2016-11-02 | Discharge: 2016-11-02 | Disposition: A | Discharge status: Home / Self Care | Payer: BC Managed Care – PPO | Source: Ambulatory Visit | Attending: Gastroenterology | Admitting: Gastroenterology

## 2016-11-02 ENCOUNTER — Ambulatory Visit: Payer: BC Managed Care – PPO | Admitting: Certified Registered"

## 2016-11-02 DIAGNOSIS — Z1211 Encounter for screening for malignant neoplasm of colon: Secondary | ICD-10-CM | POA: Diagnosis not present

## 2016-11-02 DIAGNOSIS — K640 First degree hemorrhoids: Secondary | ICD-10-CM | POA: Diagnosis not present

## 2016-11-02 DIAGNOSIS — Z79899 Other long term (current) drug therapy: Secondary | ICD-10-CM | POA: Insufficient documentation

## 2016-11-02 DIAGNOSIS — Z823 Family history of stroke: Secondary | ICD-10-CM | POA: Insufficient documentation

## 2016-11-02 DIAGNOSIS — E785 Hyperlipidemia, unspecified: Secondary | ICD-10-CM | POA: Diagnosis not present

## 2016-11-02 DIAGNOSIS — Z8249 Family history of ischemic heart disease and other diseases of the circulatory system: Secondary | ICD-10-CM | POA: Diagnosis not present

## 2016-11-02 DIAGNOSIS — Z825 Family history of asthma and other chronic lower respiratory diseases: Secondary | ICD-10-CM | POA: Diagnosis not present

## 2016-11-02 DIAGNOSIS — N181 Chronic kidney disease, stage 1: Secondary | ICD-10-CM | POA: Insufficient documentation

## 2016-11-02 DIAGNOSIS — Z7982 Long term (current) use of aspirin: Secondary | ICD-10-CM | POA: Insufficient documentation

## 2016-11-02 DIAGNOSIS — T39395S Adverse effect of other nonsteroidal anti-inflammatory drugs [NSAID], sequela: Secondary | ICD-10-CM | POA: Diagnosis not present

## 2016-11-02 DIAGNOSIS — Z833 Family history of diabetes mellitus: Secondary | ICD-10-CM | POA: Insufficient documentation

## 2016-11-02 DIAGNOSIS — K219 Gastro-esophageal reflux disease without esophagitis: Secondary | ICD-10-CM | POA: Diagnosis not present

## 2016-11-02 DIAGNOSIS — I129 Hypertensive chronic kidney disease with stage 1 through stage 4 chronic kidney disease, or unspecified chronic kidney disease: Secondary | ICD-10-CM | POA: Diagnosis not present

## 2016-11-02 DIAGNOSIS — D120 Benign neoplasm of cecum: Secondary | ICD-10-CM | POA: Insufficient documentation

## 2016-11-02 DIAGNOSIS — D122 Benign neoplasm of ascending colon: Secondary | ICD-10-CM | POA: Diagnosis not present

## 2016-11-02 HISTORY — PX: COLONOSCOPY WITH PROPOFOL: SHX5780

## 2016-11-02 SURGERY — COLONOSCOPY WITH PROPOFOL
Anesthesia: General

## 2016-11-02 MED ORDER — LIDOCAINE 2% (20 MG/ML) 5 ML SYRINGE
INTRAMUSCULAR | Status: DC | PRN
  Administered 2016-11-02: 25 mg via INTRAVENOUS

## 2016-11-02 MED ORDER — LIDOCAINE HCL (PF) 1 % IJ SOLN
2.0000 mL | Freq: Once | INTRAMUSCULAR | Status: AC
  Administered 2016-11-02: 0.3 mL via INTRADERMAL

## 2016-11-02 MED ORDER — PROPOFOL 500 MG/50ML IV EMUL
INTRAVENOUS | Status: DC | PRN
  Administered 2016-11-02: 120 ug/kg/min via INTRAVENOUS

## 2016-11-02 MED ORDER — PROPOFOL 10 MG/ML IV BOLUS
INTRAVENOUS | Status: DC | PRN
  Administered 2016-11-02 (×2): 50 mg via INTRAVENOUS

## 2016-11-02 MED ORDER — SODIUM CHLORIDE 0.9 % IV SOLN
INTRAVENOUS | Status: DC
  Administered 2016-11-02: 1000 mL via INTRAVENOUS

## 2016-11-02 MED ORDER — LIDOCAINE HCL (PF) 1 % IJ SOLN
INTRAMUSCULAR | Status: AC
  Administered 2016-11-02: 0.3 mL via INTRADERMAL
  Filled 2016-11-02: qty 2

## 2016-11-02 NOTE — Transfer of Care (Signed)
Immediate Anesthesia Transfer of Care Note  Patient: Colton Brown  Procedure(s) Performed: Procedure(s): COLONOSCOPY WITH PROPOFOL (N/A)  Patient Location: Endoscopy Unit  Anesthesia Type:General  Level of Consciousness: awake and alert   Airway & Oxygen Therapy: Patient Spontanous Breathing and Patient connected to nasal cannula oxygen  Post-op Assessment: Report given to RN and Post -op Vital signs reviewed and stable  Post vital signs: Reviewed  Last Vitals:  Vitals:   11/02/16 1022 11/02/16 1109  BP: 127/77 109/71  Pulse: 69 81  Resp: 17 18  Temp: (!) 36.1 C 36.4 C    Last Pain:  Vitals:   11/02/16 1022  TempSrc: Tympanic         Complications: No apparent anesthesia complications

## 2016-11-02 NOTE — Op Note (Signed)
[facility] Patient Name: Colton Brown Procedure Date: 11/02/2016 10:48 AM MRN: [id number] Account #: [id number] Date of Birth: 12/27/1955 Admit Type: Outpatient Age: 61 Room: [facility] ENDO ROOM 4 Gender: Male Note Status: Finalized Procedure:            Colonoscopy Indications:          Screening for colorectal malignant neoplasm Providers:            Lucilla Lame MD, MD Referring MD:         Kathrine Haddock (Referring MD) Medicines:            Propofol per Anesthesia Complications:        No immediate complications. Procedure:            Pre-Anesthesia Assessment:                       - Prior to the procedure, a History and Physical was                        performed, and patient medications and allergies were                        reviewed. The patient's tolerance of previous                        anesthesia was also reviewed. The risks and benefits of                        the procedure and the sedation options and risks were                        discussed with the patient. All questions were                        answered, and informed consent was obtained. Prior                        Anticoagulants: The patient has taken no previous                        anticoagulant or antiplatelet agents. ASA Grade                        Assessment: II - A patient with mild systemic disease.                        After reviewing the risks and benefits, the patient was                        deemed in satisfactory condition to undergo the                        procedure.                       After obtaining informed consent, the colonoscope was                        passed under direct vision. Throughout the procedure,  the patient's blood pressure, pulse, and oxygen                        saturations were monitored continuously. The                        Colonoscope was introduced through the anus and   advanced to the the cecum, identified by appendiceal                        orifice and ileocecal valve. The colonoscopy was                        performed without difficulty. The patient tolerated the                        procedure well. The quality of the bowel preparation                        was excellent. Findings:      The perianal and digital rectal examinations were normal.      A 6 mm polyp was found in the ascending colon. The polyp was sessile.       The polyp was removed with a cold snare. Resection and retrieval were       complete.      Non-bleeding internal hemorrhoids were found during retroflexion. The       hemorrhoids were Grade I (internal hemorrhoids that do not prolapse). Impression:           - One 6 mm polyp in the ascending colon, removed with a                        cold snare. Resected and retrieved.                       - Non-bleeding internal hemorrhoids. Recommendation:       - Discharge patient to home.                       - Resume previous diet.                       - Continue present medications.                       - Await pathology results.                       - Repeat colonoscopy in 5 years if polyp adenoma and 10                        years if hyperplastic Procedure Code(s):    --- Professional ---                       [phone], Colonoscopy, flexible; with removal of tumor(s),                        polyp(s), or other lesion(s) by snare technique Diagnosis Code(s):    --- Professional ---  Z12.11, Encounter for screening for malignant neoplasm                        of colon                       D12.2, Benign neoplasm of ascending colon CPT copyright 2016 American Medical Association. All rights reserved. The codes documented in this report are preliminary and upon coder review may  be revised to meet current compliance requirements. Lucilla Lame MD, MD 11/02/2016 11:07:15 AM This report has been signed  electronically. Number of Addenda: 0 Note Initiated On: 11/02/2016 10:48 AM Scope Withdrawal Time: 0 hours 7 minutes 55 seconds  Total Procedure Duration: 0 hours 11 minutes 22 seconds       [facility]

## 2016-11-02 NOTE — Anesthesia Postprocedure Evaluation (Signed)
Anesthesia Post Note  Patient: Colton Brown  Procedure(s) Performed: Procedure(s) (LRB): COLONOSCOPY WITH PROPOFOL (N/A)  Patient location during evaluation: PACU Anesthesia Type: General Level of consciousness: awake Pain management: pain level controlled Vital Signs Assessment: post-procedure vital signs reviewed and stable Respiratory status: spontaneous breathing Cardiovascular status: stable Anesthetic complications: no     Last Vitals:  Vitals:   11/02/16 1140 11/02/16 1145  BP: 107/68   Pulse: 66 62  Resp: 10 13  Temp:      Last Pain:  Vitals:   11/02/16 1022  TempSrc: Tympanic                 VAN STAVEREN,Jayvian Escoe

## 2016-11-02 NOTE — Anesthesia Post-op Follow-up Note (Cosign Needed)
Anesthesia QCDR form completed.        

## 2016-11-02 NOTE — Anesthesia Preprocedure Evaluation (Signed)
Anesthesia Evaluation  Patient identified by MRN, date of birth, ID band Patient awake    Reviewed: Allergy & Precautions, NPO status , Patient's Chart, lab work & pertinent test results  Airway Mallampati: II       Dental  (+) Teeth Intact   Pulmonary neg pulmonary ROS,    breath sounds clear to auscultation       Cardiovascular Exercise Tolerance: Good hypertension, Pt. on medications  Rhythm:Regular     Neuro/Psych negative neurological ROS  negative psych ROS   GI/Hepatic Neg liver ROS, GERD  Medicated,  Endo/Other  negative endocrine ROS  Renal/GU Renal InsufficiencyRenal disease     Musculoskeletal negative musculoskeletal ROS (+)   Abdominal Normal abdominal exam  (+)   Peds negative pediatric ROS (+)  Hematology negative hematology ROS (+)   Anesthesia Other Findings   Reproductive/Obstetrics                             Anesthesia Physical Anesthesia Plan  ASA: II  Anesthesia Plan: General   Post-op Pain Management:    Induction: Intravenous  PONV Risk Score and Plan: 0  Airway Management Planned: Natural Airway  Additional Equipment:   Intra-op Plan:   Post-operative Plan:   Informed Consent: I have reviewed the patients History and Physical, chart, labs and discussed the procedure including the risks, benefits and alternatives for the proposed anesthesia with the patient or authorized representative who has indicated his/her understanding and acceptance.     Plan Discussed with: CRNA  Anesthesia Plan Comments:         Anesthesia Quick Evaluation

## 2016-11-02 NOTE — H&P (Signed)
Lucilla Lame, MD [facility] [address] [place], [place] [address] Phone: [phone] Fax : [phone]  Primary Care Physician:  Kathrine Haddock, NP Primary Gastroenterologist:  Dr. Allen Norris  Pre-Procedure History & Physical: HPI:  Colton Brown is a 61 y.o. male is here for a screening colonoscopy.   Past Medical History:  Diagnosis Date  . 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 GERD (gastroesophageal reflux disease)    DUE TO GALLBLADDER-NO MEDS  . Hyperlipidemia   . Lumbago   . Psoriasis     Past Surgical History:  Procedure Laterality Date  . ANKLE SURGERY Left 1989  . APPENDECTOMY  1979  . CHOLECYSTECTOMY N/A 11/11/2015   Procedure: LAPAROSCOPIC CHOLECYSTECTOMY WITH INTRAOPERATIVE CHOLANGIOGRAM;  Surgeon: Hubbard Robinson, MD;  Location: [facility] ORS;  Service: General;  Laterality: N/A;  . LUMBAR DISC SURGERY     L4-L5  . WISDOM TOOTH EXTRACTION      Prior to Admission medications   Medication Sig Start Date End Date Taking? Authorizing Provider  aspirin 81 MG tablet Take 81 mg by mouth daily.    [provider]  atorvastatin (LIPITOR) 20 MG tablet Take 1 tablet (20 mg total) by mouth daily. 09/14/16   Kathrine Haddock, NP  clobetasol (TEMOVATE) 0.05 % external solution Apply 1 application topically 2 (two) times daily as needed. 09/14/16   Kathrine Haddock, NP  doxycycline (VIBRA-TABS) 100 MG tablet Take 1 tablet (100 mg total) by mouth 2 (two) times daily. 09/11/16   Norval Gable, MD  lisinopril (PRINIVIL,ZESTRIL) 2.5 MG tablet Take 1 tablet (2.5 mg total) by mouth daily. 09/14/16   Kathrine Haddock, NP  Turmeric 500 MG CAPS Take 1 capsule by mouth daily.    [provider]    Allergies as of 09/21/2016  . (No Known Allergies)    Family History  Problem Relation Age of Onset  . Asthma Mother   . Stroke Mother   . Migraines Mother   . Thyroid disease Mother   . Diabetes Mother   . Heart disease Father   . Hyperlipidemia Father     . Hypertension Father   . Stroke Father   . Hyperlipidemia Brother   . Hypertension Brother   . Diabetes Maternal Grandmother   . Diabetes Maternal Grandfather   . Heart disease Paternal Grandmother     Social History   Social History  . Marital status: Divorced    Spouse name: N/A  . Number of children: N/A  . Years of education: N/A   Occupational History  . Not on file.   Social History Main Topics  . Smoking status: Never Smoker  . Smokeless tobacco: Never Used  . Alcohol use No  . Drug use: No  . Sexual activity: Yes   Other Topics Concern  . Not on file   Social History Narrative  . No narrative on file    Review of Systems: See HPI, otherwise negative ROS  Physical Exam: BP 127/77   Pulse 69   Temp (!) 96.9 F (36.1 C) (Tympanic)   Resp 17   Ht 5\' 6"  (1.676 m)   Wt 190 lb (86.2 kg)   SpO2 100%   BMI 30.67 kg/m  General:   Alert,  pleasant and cooperative in NAD Head:  Normocephalic and atraumatic. Neck:  Supple; no masses or thyromegaly. Lungs:  Clear throughout to auscultation.    Heart:  Regular rate and rhythm. Abdomen:  Soft, nontender  and nondistended. Normal bowel sounds, without guarding, and without rebound.   Neurologic:  Alert and  oriented x4;  grossly normal neurologically.  Impression/Plan: Colton Brown is now here to undergo a screening colonoscopy.  Risks, benefits, and alternatives regarding colonoscopy have been reviewed with the patient.  Questions have been answered.  All parties agreeable.

## 2016-11-03 ENCOUNTER — Encounter: Payer: Self-pay | Admitting: Gastroenterology

## 2016-11-03 LAB — SURGICAL PATHOLOGY

## 2016-11-04 ENCOUNTER — Encounter: Payer: Self-pay | Admitting: Gastroenterology

## 2017-01-27 IMAGING — NM NM HEPATO W/GB/PHARM/[PERSON_NAME]
2 series · 12 of 12 positions shown · non-contrast
Comparison: None.

CLINICAL DATA: Abdominal pain.

EXAM:
NUCLEAR MEDICINE HEPATOBILIARY IMAGING WITH GALLBLADDER EF
TECHNIQUE: Sequential images of the abdomen were obtained [DATE] minutes
following intravenous administration of radiopharmaceutical. After
slow intravenous infusion of 1.73 micrograms Cholecystokinin,
gallbladder ejection fraction was determined.
RADIOPHARMACEUTICALS:  5.5 mCi Kc-EEm Choletec IV

[Series 1000: gallbladder ef · 4.80mm/px · 6 of 120 frames shown]
[frame 11/120]
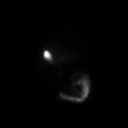
[frame 31/120]
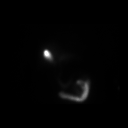
[frame 51/120]
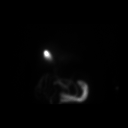
[frame 71/120]
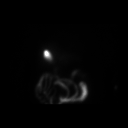
[frame 91/120]
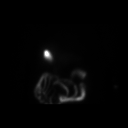
[frame 111/120]
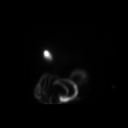

[Series 1000: hepatobiliary scan · 9.59mm/px · 6 of 60 frames shown]
[frame 6/60]
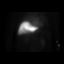
[frame 16/60]
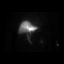
[frame 26/60]
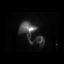
[frame 36/60]
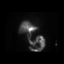
[frame 46/60]
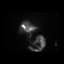
[frame 56/60]
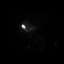

[12 of 12 positions shown; findings below may reference images not displayed]

FINDINGS: Prompt uptake and biliary excretion of activity by the liver is
seen. Gallbladder activity is visualized, consistent with patency of
cystic duct. Biliary activity passes into small bowel, consistent
with patent common bile duct.

Calculated gallbladder ejection fraction is 1%%. (At 60 min, normal
ejection fraction is greater than 40%.)
IMPRESSION: 1.  Gallbladder visualizes normally.

2.  Abnormally low gallbladder ejection fraction of 1% at 1 hour.

## 2017-03-16 ENCOUNTER — Ambulatory Visit (INDEPENDENT_AMBULATORY_CARE_PROVIDER_SITE_OTHER): Payer: BC Managed Care – PPO | Admitting: Unknown Physician Specialty

## 2017-03-16 ENCOUNTER — Encounter: Payer: Self-pay | Admitting: Unknown Physician Specialty

## 2017-03-16 VITALS — BP 104/67 | HR 62 | Temp 98.0°F | Ht 67.2 in | Wt 196.5 lb

## 2017-03-16 DIAGNOSIS — I10 Essential (primary) hypertension: Secondary | ICD-10-CM

## 2017-03-16 DIAGNOSIS — Z23 Encounter for immunization: Secondary | ICD-10-CM

## 2017-03-16 DIAGNOSIS — N181 Chronic kidney disease, stage 1: Secondary | ICD-10-CM

## 2017-03-16 LAB — MICROALBUMIN, URINE WAIVED
Creatinine, Urine Waived: 100 mg/dL (ref 10–300)
Microalb, Ur Waived: 80 mg/L — ABNORMAL HIGH (ref 0–19)

## 2017-03-16 NOTE — Patient Instructions (Addendum)

## 2017-03-16 NOTE — Assessment & Plan Note (Signed)
Stable, continue present medications.   

## 2017-03-16 NOTE — Progress Notes (Signed)
   BP 104/67   Pulse 62   Temp 98 F (36.7 C)   Ht 5' 7.2" (1.707 m)   Wt 196 lb 8 oz (89.1 kg)   SpO2 98%   BMI 30.59 kg/m    Subjective:    Patient ID: Colton Brown, male    DOB: 05-08-1956, 61 y.o.   MRN: [id number]  HPI: Colton Brown is a 61 y.o. male  Chief Complaint  Patient presents with  . Hyperlipidemia  . Hypertension   Hypertension Using medications without difficulty Average home BPs not checking   No problems or lightheadedness No chest pain with exertion or shortness of breath No Edema   Hyperlipidemia Using medications without problems: No Muscle aches  Diet compliance:Exercise: A little slack   Relevant past medical, surgical, family and social history reviewed and updated as indicated. Interim medical history since our last visit reviewed. Allergies and medications reviewed and updated.  Review of Systems  Per HPI unless specifically indicated above     Objective:    BP 104/67   Pulse 62   Temp 98 F (36.7 C)   Ht 5' 7.2" (1.707 m)   Wt 196 lb 8 oz (89.1 kg)   SpO2 98%   BMI 30.59 kg/m   Wt Readings from Last 3 Encounters:  03/16/17 196 lb 8 oz (89.1 kg)  11/02/16 190 lb (86.2 kg)  09/14/16 197 lb (89.4 kg)    Physical Exam  Constitutional: He is oriented to person, place, and time. He appears well-developed and well-nourished. No distress.  HENT:  Head: Normocephalic and atraumatic.  Eyes: Conjunctivae and lids are normal. Right eye exhibits no discharge. Left eye exhibits no discharge. No scleral icterus.  Neck: Normal range of motion. Neck supple. No JVD present. Carotid bruit is not present.  Cardiovascular: Normal rate, regular rhythm and normal heart sounds.   Pulmonary/Chest: Effort normal and breath sounds normal. No respiratory distress.  Abdominal: Normal appearance. There is no splenomegaly or hepatomegaly.  Musculoskeletal: Normal range of motion.  Neurological: He is alert and oriented to person, place, and time.  Skin:  Skin is warm, dry and intact. No rash noted. No pallor.  Psychiatric: He has a normal mood and affect. His behavior is normal. Judgment and thought content normal.     Assessment & Plan:   Problem List Items Addressed This Visit      Unprioritized   CKD (chronic kidney disease)   Relevant Orders   Microalbumin, Urine Waived   Essential hypertension, benign    Stable, continue present medications.         Other Visit Diagnoses    Need for influenza vaccination    -  Primary   Relevant Orders   Flu Vaccine QUAD 36+ mos IM (Completed)      Check Microalbumin  Follow up plan: Return in about 6 months (around 09/14/2017).

## 2017-04-14 ENCOUNTER — Other Ambulatory Visit: Payer: Self-pay | Admitting: Unknown Physician Specialty

## 2017-04-14 DIAGNOSIS — I10 Essential (primary) hypertension: Secondary | ICD-10-CM

## 2017-04-29 ENCOUNTER — Other Ambulatory Visit: Payer: Self-pay | Admitting: Unknown Physician Specialty

## 2017-04-29 DIAGNOSIS — E7800 Pure hypercholesterolemia, unspecified: Secondary | ICD-10-CM

## 2017-09-14 ENCOUNTER — Ambulatory Visit: Payer: BC Managed Care – PPO | Admitting: Unknown Physician Specialty

## 2017-09-16 ENCOUNTER — Ambulatory Visit: Payer: BC Managed Care – PPO | Admitting: Unknown Physician Specialty

## 2017-09-16 ENCOUNTER — Encounter: Payer: Self-pay | Admitting: Unknown Physician Specialty

## 2017-09-16 VITALS — BP 112/65 | HR 68 | Temp 98.3°F | Ht 65.5 in | Wt 196.7 lb

## 2017-09-16 DIAGNOSIS — Z0000 Encounter for general adult medical examination without abnormal findings: Secondary | ICD-10-CM

## 2017-09-16 DIAGNOSIS — E7800 Pure hypercholesterolemia, unspecified: Secondary | ICD-10-CM | POA: Diagnosis not present

## 2017-09-16 DIAGNOSIS — L409 Psoriasis, unspecified: Secondary | ICD-10-CM

## 2017-09-16 DIAGNOSIS — E669 Obesity, unspecified: Secondary | ICD-10-CM

## 2017-09-16 DIAGNOSIS — N181 Chronic kidney disease, stage 1: Secondary | ICD-10-CM

## 2017-09-16 DIAGNOSIS — I10 Essential (primary) hypertension: Secondary | ICD-10-CM | POA: Diagnosis not present

## 2017-09-16 LAB — MICROALBUMIN, URINE WAIVED
CREATININE, URINE WAIVED: 100 mg/dL (ref 10–300)
MICROALB, UR WAIVED: 80 mg/L — AB (ref 0–19)

## 2017-09-16 NOTE — Assessment & Plan Note (Signed)
GFR is good.  Check again today and check a Microalbumin

## 2017-09-16 NOTE — Assessment & Plan Note (Signed)
Pt plans to work on diet and exercise

## 2017-09-16 NOTE — Assessment & Plan Note (Signed)
Stable, continue present medications.   

## 2017-09-16 NOTE — Assessment & Plan Note (Signed)
Continue present medications. 

## 2017-09-16 NOTE — Progress Notes (Signed)
BP 112/65   Pulse 68   Temp 98.3 F (36.8 C) (Oral)   Ht 5' 5.5" (1.664 m)   Wt 196 lb 11.2 oz (89.2 kg)   SpO2 96%   BMI 32.23 kg/m    Subjective:    Patient ID: Colton Brown, male    DOB: [birth year], 62 y.o.   MRN: [id number]  HPI: Colton Brown is a 62 y.o. male  Chief Complaint  Patient presents with  . Annual Exam   Hypertension Using medications without difficulty Average home BPs Does not check   No problems or lightheadedness No chest pain with exertion or shortness of breath No Edema  Hyperlipidemia Using medications without problems: No Muscle aches  Diet compliance:Exercise: Not going to gym like he is supposed to.  Diet is "pretty good."    Relevant past medical, surgical, family and social history reviewed and updated as indicated. Interim medical history since our last visit reviewed. Allergies and medications reviewed and updated.  Review of Systems  Constitutional: Negative.   HENT: Negative.   Eyes: Negative.   Respiratory: Negative.   Cardiovascular: Negative.   Gastrointestinal: Negative.   Endocrine: Negative.   Genitourinary: Negative.   Skin: Negative.        Uses Temovate on occasion.     Allergic/Immunologic: Negative.   Neurological: Negative.   Hematological: Negative.   Psychiatric/Behavioral: Negative.     Per HPI unless specifically indicated above     Objective:    BP 112/65   Pulse 68   Temp 98.3 F (36.8 C) (Oral)   Ht 5' 5.5" (1.664 m)   Wt 196 lb 11.2 oz (89.2 kg)   SpO2 96%   BMI 32.23 kg/m   Wt Readings from Last 3 Encounters:  09/16/17 196 lb 11.2 oz (89.2 kg)  03/16/17 196 lb 8 oz (89.1 kg)  11/02/16 190 lb (86.2 kg)    Physical Exam  Constitutional: He is oriented to person, place, and time. He appears well-developed and well-nourished. No distress.  HENT:  Head: Normocephalic and atraumatic.  Right Ear: Tympanic membrane, external ear and ear canal normal.  Left Ear: Tympanic membrane, external ear and  ear canal normal.  Mouth/Throat: Uvula is midline, oropharynx is clear and moist and mucous membranes are normal.  Eyes: Pupils are equal, round, and reactive to light. Conjunctivae and lids are normal. Right eye exhibits no discharge. Left eye exhibits no discharge. No scleral icterus.  Neck: Normal range of motion. Neck supple. No JVD present. Carotid bruit is not present.  Cardiovascular: Normal rate, regular rhythm and normal heart sounds. Exam reveals no gallop and no friction rub.  No murmur heard. Pulmonary/Chest: Effort normal and breath sounds normal. No respiratory distress.  Abdominal: Soft. Normal appearance and bowel sounds are normal. He exhibits no distension. There is no splenomegaly or hepatomegaly. There is no tenderness.  Musculoskeletal: Normal range of motion.  Neurological: He is alert and oriented to person, place, and time. He has normal reflexes.  Skin: Skin is warm, dry and intact. No rash noted. No pallor.  Psychiatric: He has a normal mood and affect. His behavior is normal. Judgment and thought content normal.    Results for orders placed or performed in visit on 03/16/17  Microalbumin, Urine Waived  Result Value Ref Range   Microalb, Ur Waived 80 (H) 0 - 19 mg/L   Creatinine, Urine Waived 100 10 - 300 mg/dL   Microalb/Creat Ratio 30-300 (H) <30 mg/g  Assessment & Plan:   Problem List Items Addressed This Visit      Unprioritized   Essential hypertension, benign    Stable, continue present medications.        Relevant Orders   Comprehensive metabolic panel   Lipid Panel w/o Chol/HDL Ratio   Microalbumin, Urine Waived   Uric acid   Hyperlipidemia    Check labs today.        Obesity (BMI 30.0-34.9)    Pt plans to work on diet and exercise      Psoriasis    Continue present medications       Other Visit Diagnoses    Annual physical exam    -  Primary   Relevant Orders   CBC with Differential/Platelet   PSA   Chronic kidney disease,  stage I          Td due next year Colonoscopy due 2023  Follow up plan: No follow-ups on file.

## 2017-09-16 NOTE — Assessment & Plan Note (Signed)
Check labs today.

## 2017-09-17 LAB — COMPREHENSIVE METABOLIC PANEL
A/G RATIO: 2.1 (ref 1.2–2.2)
ALK PHOS: 67 IU/L (ref 39–117)
ALT: 34 IU/L (ref 0–44)
AST: 26 IU/L (ref 0–40)
Albumin: 4.7 g/dL (ref 3.6–4.8)
BILIRUBIN TOTAL: 1 mg/dL (ref 0.0–1.2)
BUN/Creatinine Ratio: 10 (ref 10–24)
BUN: 9 mg/dL (ref 8–27)
CHLORIDE: 101 mmol/L (ref 96–106)
CO2: 25 mmol/L (ref 20–29)
Calcium: 9.1 mg/dL (ref 8.6–10.2)
Creatinine, Ser: 0.88 mg/dL (ref 0.76–1.27)
GFR calc non Af Amer: 93 mL/min/{1.73_m2} (ref >59)
GFR, EST AFRICAN AMERICAN: 108 mL/min/{1.73_m2} (ref >59)
GLUCOSE: 88 mg/dL (ref 65–99)
Globulin, Total: 2.2 g/dL (ref 1.5–4.5)
POTASSIUM: 4.1 mmol/L (ref 3.5–5.2)
Sodium: 141 mmol/L (ref 134–144)
Total Protein: 6.9 g/dL (ref 6.0–8.5)

## 2017-09-17 LAB — CBC WITH DIFFERENTIAL/PLATELET
BASOS: 1 %
Basophils Absolute: 0 10*3/uL (ref 0.0–0.2)
EOS (ABSOLUTE): 0.2 10*3/uL (ref 0.0–0.4)
Eos: 3 %
Hematocrit: 44.7 % (ref 37.5–51.0)
Hemoglobin: 15.4 g/dL (ref 13.0–17.7)
Immature Grans (Abs): 0 10*3/uL (ref 0.0–0.1)
Immature Granulocytes: 0 %
LYMPHS ABS: 2 10*3/uL (ref 0.7–3.1)
Lymphs: 29 %
MCH: 30.1 pg (ref 26.6–33.0)
MCHC: 34.5 g/dL (ref 31.5–35.7)
MCV: 87 fL (ref 79–97)
MONOS ABS: 0.5 10*3/uL (ref 0.1–0.9)
Monocytes: 7 %
NEUTROS ABS: 4.2 10*3/uL (ref 1.4–7.0)
Neutrophils: 60 %
PLATELETS: 169 10*3/uL (ref 150–379)
RBC: 5.12 x10E6/uL (ref 4.14–5.80)
RDW: 14.6 % (ref 12.3–15.4)
WBC: 7 10*3/uL (ref 3.4–10.8)

## 2017-09-17 LAB — LIPID PANEL W/O CHOL/HDL RATIO
CHOLESTEROL TOTAL: 136 mg/dL (ref 100–199)
HDL: 44 mg/dL (ref >39)
LDL Calculated: 58 mg/dL (ref 0–99)
Triglycerides: 172 mg/dL — ABNORMAL HIGH (ref 0–149)
VLDL CHOLESTEROL CAL: 34 mg/dL (ref 5–40)

## 2017-09-17 LAB — PSA: Prostate Specific Ag, Serum: 1.2 ng/mL (ref 0.0–4.0)

## 2017-09-17 LAB — URIC ACID: URIC ACID: 5.4 mg/dL (ref 3.7–8.6)

## 2017-09-19 ENCOUNTER — Encounter: Payer: Self-pay | Admitting: Unknown Physician Specialty

## 2017-10-05 ENCOUNTER — Other Ambulatory Visit: Payer: Self-pay | Admitting: Unknown Physician Specialty

## 2017-10-05 DIAGNOSIS — I10 Essential (primary) hypertension: Secondary | ICD-10-CM

## 2017-10-17 ENCOUNTER — Other Ambulatory Visit: Payer: Self-pay | Admitting: Unknown Physician Specialty

## 2017-10-17 DIAGNOSIS — E7800 Pure hypercholesterolemia, unspecified: Secondary | ICD-10-CM

## 2018-01-04 IMAGING — US US ABDOMEN LIMITED
1 series · 14 of 25 positions shown · non-contrast
Comparison: None.

CLINICAL DATA: Right upper quadrant pain .

EXAM:
US ABDOMEN LIMITED - RIGHT UPPER QUADRANT

[Series 1: us abdomen limited · 0.22mm/px · 14 of 57 slices shown]
[im 1/57]
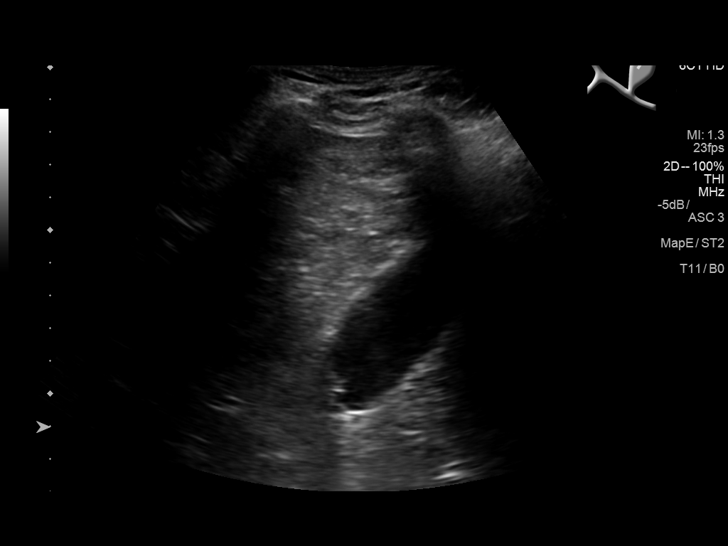
[im 5/57]
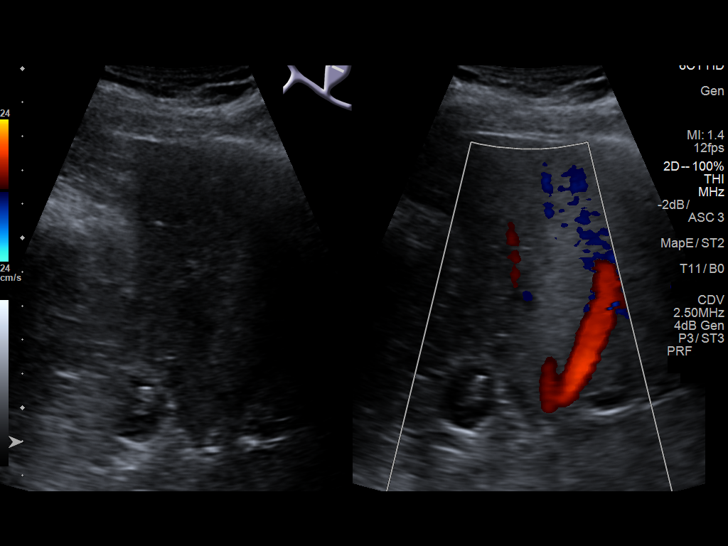
[im 10/57]
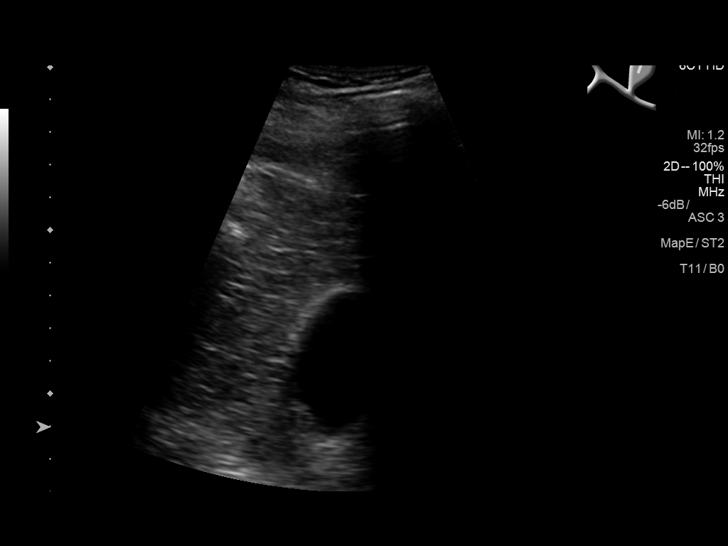
[im 15/57]
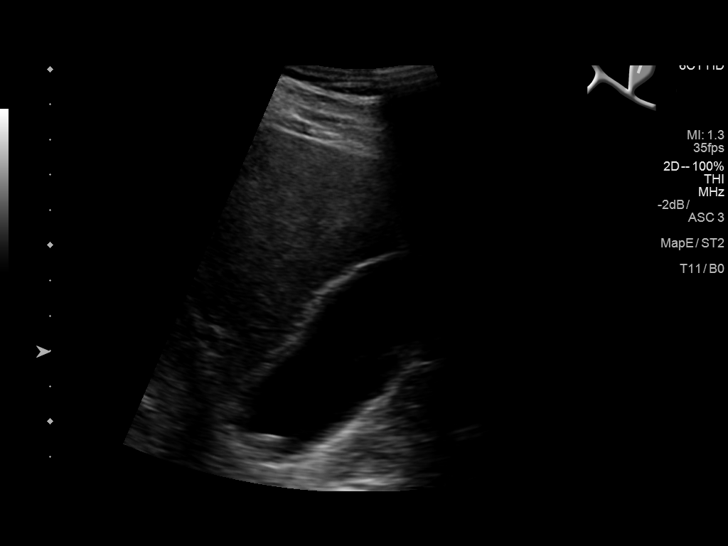
[im 19/57]
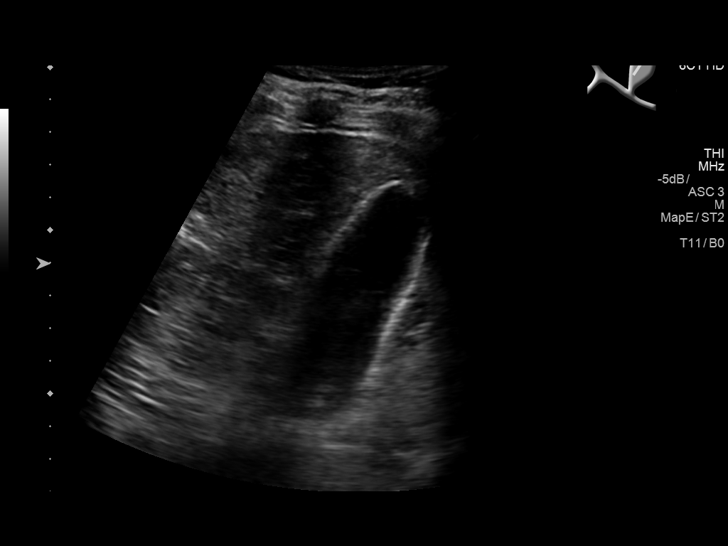
[im 22/57]
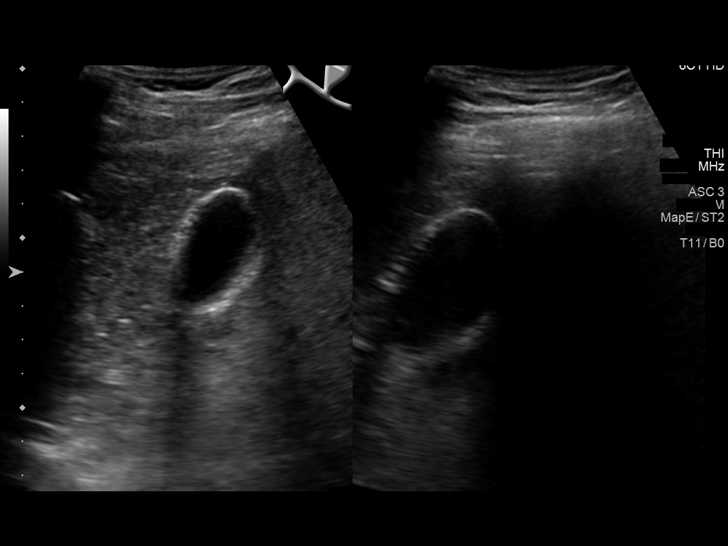
[im 26/57]
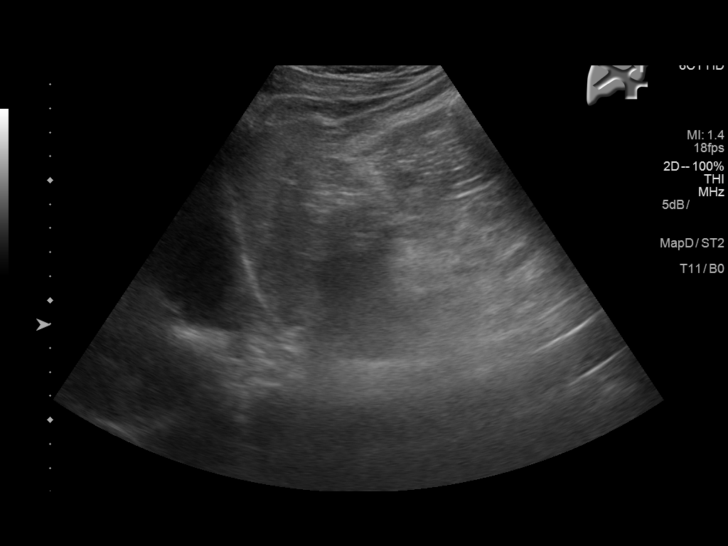
[im 31/57]
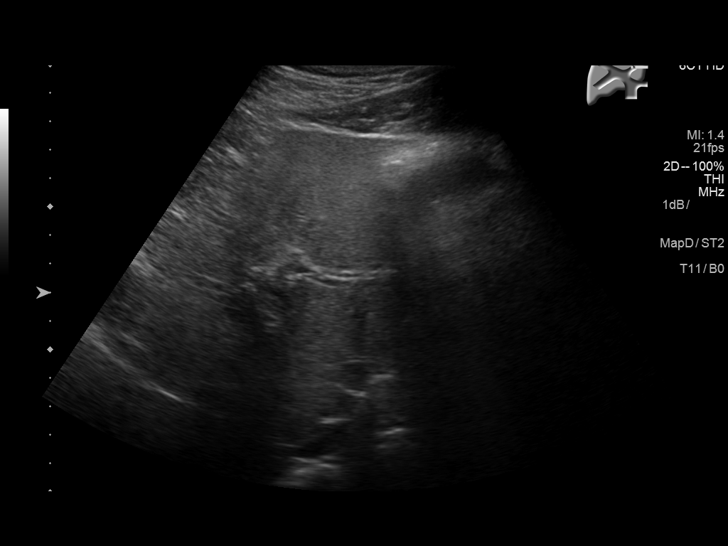
[im 36/57]
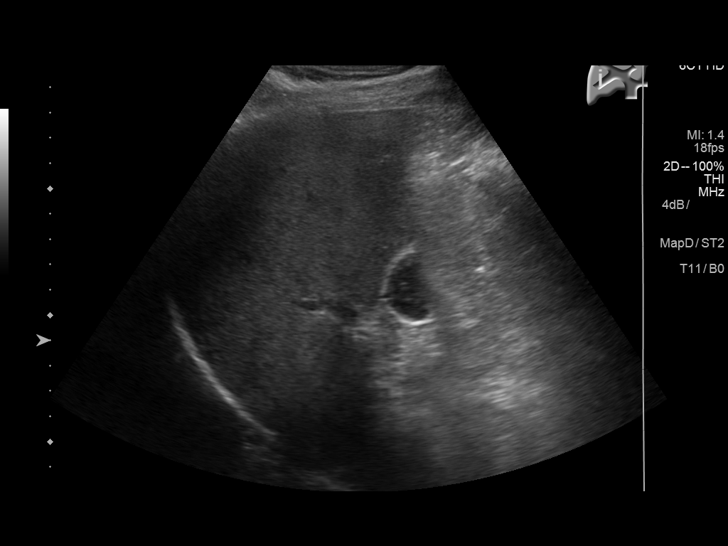
[im 38/57]
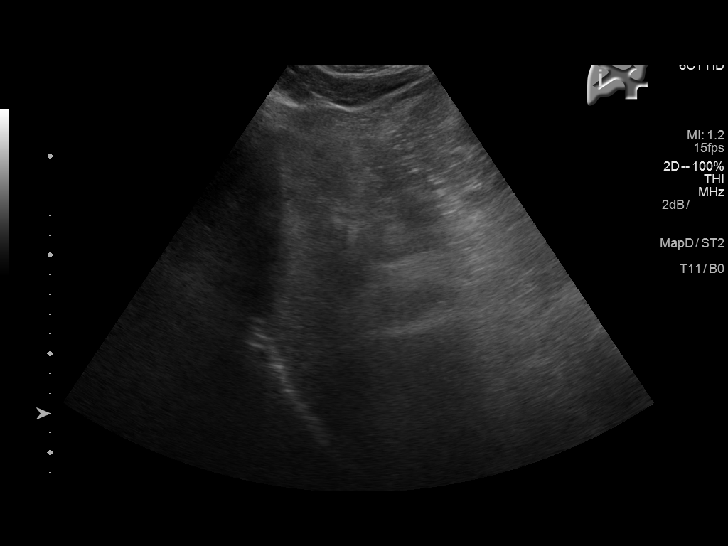
[im 43/57]
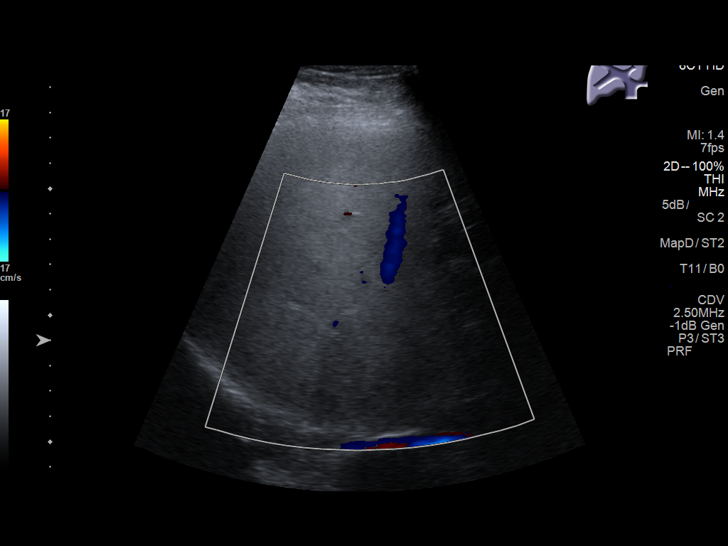
[im 47/57]
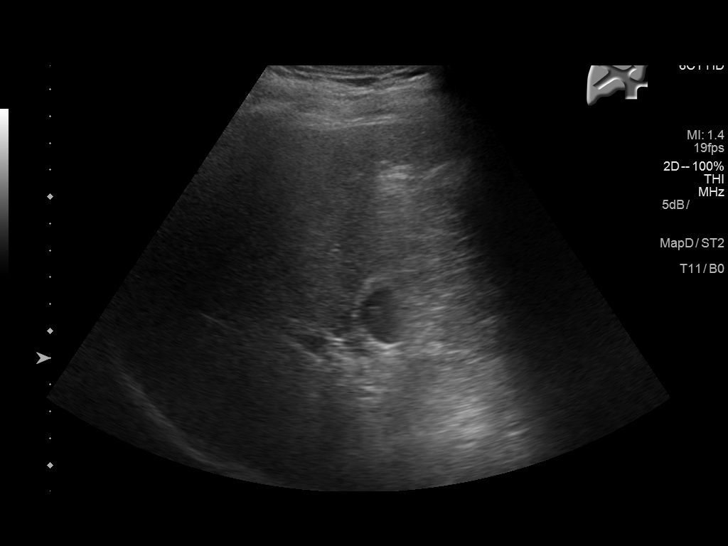
[im 52/57]
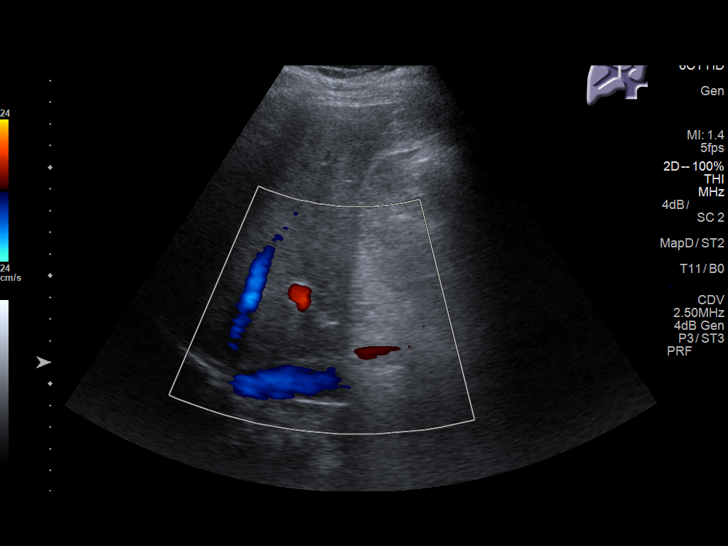
[im 57/57]
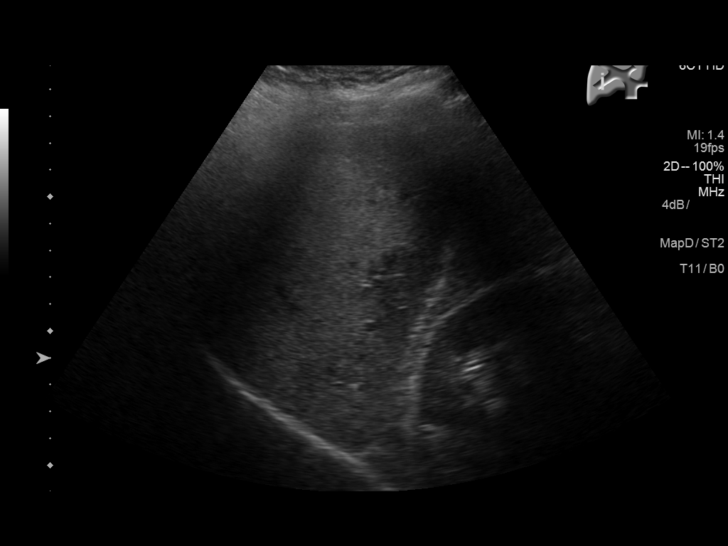

[14 of 25 positions shown; findings below may reference images not displayed]

FINDINGS: Gallbladder:

1.5 mm non mobile echogenic focus in the neck of the gallbladder.
This could represent a small polyp, tumefactive sludge, or non
mobile non shadowing gallstone. Gallbladder wall thickness normal at
2.3 mm. Negative Murphy sign. No pericholecystic fluid collection.

Common bile duct:

Diameter: 4.2 mm

Liver:

Liver is echogenic consistent fatty infiltration and/or
hepatocellular disease.
IMPRESSION: 1. 1.5 mm non mobile echogenic focus in the neck of the gallbladder.
This could represent small polyp, tumefactive sludge, or non mobile
non shadowing gallstone. No evidence cholecystitis or biliary
distention.

2. The liver is echogenic consistent fatty infiltration and/or
hepatocellular disease.

## 2018-03-20 ENCOUNTER — Ambulatory Visit: Payer: BC Managed Care – PPO | Admitting: Nurse Practitioner

## 2018-03-20 ENCOUNTER — Other Ambulatory Visit: Payer: Self-pay | Admitting: Family Medicine

## 2018-03-20 ENCOUNTER — Other Ambulatory Visit: Payer: Self-pay | Admitting: Nurse Practitioner

## 2018-03-20 ENCOUNTER — Ambulatory Visit: Payer: BC Managed Care – PPO | Admitting: Unknown Physician Specialty

## 2018-03-20 ENCOUNTER — Other Ambulatory Visit: Payer: Self-pay

## 2018-03-20 ENCOUNTER — Encounter: Payer: Self-pay | Admitting: Nurse Practitioner

## 2018-03-20 VITALS — BP 127/82 | HR 77 | Temp 97.9°F | Ht 66.2 in | Wt 198.2 lb

## 2018-03-20 DIAGNOSIS — E7800 Pure hypercholesterolemia, unspecified: Secondary | ICD-10-CM

## 2018-03-20 DIAGNOSIS — I10 Essential (primary) hypertension: Secondary | ICD-10-CM | POA: Diagnosis not present

## 2018-03-20 DIAGNOSIS — E669 Obesity, unspecified: Secondary | ICD-10-CM

## 2018-03-20 DIAGNOSIS — Z23 Encounter for immunization: Secondary | ICD-10-CM

## 2018-03-20 LAB — HGB A1C W/O EAG: Hgb A1c MFr Bld: 5.7 % — ABNORMAL HIGH (ref 4.8–5.6)

## 2018-03-20 NOTE — Assessment & Plan Note (Signed)
Chronic, ongoing.  Lipid panel today.  Last ASCVD score 7.7% with TCHOL 136.

## 2018-03-20 NOTE — Assessment & Plan Note (Signed)
Chronic, stable on current medication regimen.  Continue current regimen.

## 2018-03-20 NOTE — Assessment & Plan Note (Signed)
Chronic, continues to focus on exercise.  Encouraged eating heart healthy diet.

## 2018-03-20 NOTE — Progress Notes (Signed)
BP 127/82   Pulse 77   Temp 97.9 F (36.6 C) (Oral)   Ht 5' 6.2" (1.681 m)   Wt 198 lb 3 oz (89.9 kg)   SpO2 98%   BMI 31.80 kg/m    Subjective:    Patient ID: Colton Brown, male    DOB: 12-11-1955, 62 y.o.   MRN: [id number]  HPI: Colton Brown is a 62 y.o. male presents for annual physical  Chief Complaint  Patient presents with  . Annual Exam   HYPERTENSION / HYPERLIPIDEMIA Reports good compliance with medications and has no concerns or questions.  States he recently pulled muscle in his chest when outside preparing church for [facility], but denies chest pain or palpitations.  States he has HTN because he at one time took "too much Ibuprofen for my back pain" and that is when HTN started.  He no longer takes IBP.   Satisfied with current treatment? yes Duration of hypertension: chronic BP monitoring frequency: rarely BP range:  BP medication side effects: no Past BP meds: none Duration of hyperlipidemia: chronic Cholesterol medication side effects: no Cholesterol supplements: none Past cholesterol medications: none Medication compliance: excellent compliance Aspirin: yes Recent stressors: no Recurrent headaches: no Visual changes: no Palpitations: no Dyspnea: no Chest pain: no Lower extremity edema: no Dizzy/lightheaded: no   The 10-year ASCVD risk score Mikey Bussing DC Jr., et al., 2013) is: 7.7%   Values used to calculate the score:     Age: 22 years     Sex: Male     Is Non-Hispanic African American: No     Diabetic: No     Tobacco smoker: No     Systolic Blood Pressure: 537 mmHg     Is BP treated: Yes     HDL Cholesterol: 44 mg/dL     Total Cholesterol: 136 mg/dL  OBESITY: Chronic, ongoing.  Slight gain this review period.  He reports he goes to work out twice a week at [employer] in [place].  He is "very active", involved in church events and preparations frequently.  He does endorse not always following a heart healthy diet.    Seborrheic  keratosis: Has scattered seborrheic keratosis he is concerned about and is requesting a dermatology consult.  States he is outside often, but does wear sunscreen at all times and a hat.  Reports he has a "mole taken off my back" a few years ago.  States it was "large and deep in there", but that it was "not cancer".    Relevant past medical, surgical, family and social history reviewed and updated as indicated. Interim medical history since our last visit reviewed. Allergies and medications reviewed and updated.  Review of Systems  Constitutional: Negative for activity change, fatigue and fever.  HENT: Negative for congestion, ear pain, rhinorrhea, sinus pressure, sinus pain and sore throat.   Eyes: Negative for pain, discharge and redness.  Respiratory: Negative for cough, chest tightness, shortness of breath and wheezing.   Cardiovascular: Negative for chest pain, palpitations and leg swelling.  Gastrointestinal: Negative for abdominal distention and abdominal pain.  Endocrine: Negative for cold intolerance, polydipsia, polyphagia and polyuria.  Genitourinary: Negative for difficulty urinating.  Musculoskeletal: Negative for back pain, myalgias and neck pain.  Skin: Negative.   Allergic/Immunologic: Negative.   Neurological: Negative for dizziness, syncope, weakness, numbness and headaches.  Hematological: Negative.   Psychiatric/Behavioral: Negative for behavioral problems. The patient is not nervous/anxious.    Per HPI unless specifically indicated above  Objective:    BP 127/82   Pulse 77   Temp 97.9 F (36.6 C) (Oral)   Ht 5' 6.2" (1.681 m)   Wt 198 lb 3 oz (89.9 kg)   SpO2 98%   BMI 31.80 kg/m   Wt Readings from Last 3 Encounters:  03/20/18 198 lb 3 oz (89.9 kg)  09/16/17 196 lb 11.2 oz (89.2 kg)  03/16/17 196 lb 8 oz (89.1 kg)    Physical Exam  Constitutional: He is oriented to person, place, and time. He appears well-developed and well-nourished.  HENT:  Head:  Normocephalic and atraumatic.  Right Ear: Hearing, tympanic membrane, external ear and ear canal normal.  Left Ear: Hearing, tympanic membrane, external ear and ear canal normal.  Nose: Nose normal.  Mouth/Throat: Uvula is midline, oropharynx is clear and moist and mucous membranes are normal. Normal dentition.  Eyes: Pupils are equal, round, and reactive to light. Conjunctivae and EOM are normal.  Neck: Normal range of motion. Neck supple. No JVD present. Carotid bruit is not present. No thyromegaly present.  Cardiovascular: Normal rate, regular rhythm, normal heart sounds and intact distal pulses.  Pulmonary/Chest: Effort normal and breath sounds normal.  Abdominal: Soft. Bowel sounds are normal.  Genitourinary: Rectum normal and penis normal.  Musculoskeletal: Normal range of motion.       Right shoulder: He exhibits normal range of motion and no deformity.       Cervical back: He exhibits normal range of motion and no tenderness.       Thoracic back: He exhibits normal range of motion and no tenderness.       Lumbar back: He exhibits normal range of motion, no tenderness and no edema.  Lymphadenopathy:    He has no cervical adenopathy.  Neurological: He is alert and oriented to person, place, and time. He has normal strength and normal reflexes. No cranial nerve deficit or sensory deficit. He displays a negative Romberg sign.  Reflex Scores:      Patellar reflexes are 2+ on the right side and 2+ on the left side. Skin: Skin is warm and dry.  Psychiatric: He has a normal mood and affect. His speech is normal and behavior is normal. Judgment and thought content normal. Cognition and memory are normal.  Nursing note and vitals reviewed.   Results for orders placed or performed in visit on 09/16/17  CBC with Differential/Platelet  Result Value Ref Range   WBC 7.0 3.4 - 10.8 x10E3/uL   RBC 5.12 4.14 - 5.80 x10E6/uL   Hemoglobin 15.4 13.0 - 17.7 g/dL   Hematocrit 44.7 37.5 - 51.0 %    MCV 87 79 - 97 fL   MCH 30.1 26.6 - 33.0 pg   MCHC 34.5 31.5 - 35.7 g/dL   RDW 14.6 12.3 - 15.4 %   Platelets 169 150 - 379 x10E3/uL   Neutrophils 60 Not Estab. %   Lymphs 29 Not Estab. %   Monocytes 7 Not Estab. %   Eos 3 Not Estab. %   Basos 1 Not Estab. %   Neutrophils Absolute 4.2 1.4 - 7.0 x10E3/uL   Lymphocytes Absolute 2.0 0.7 - 3.1 x10E3/uL   Monocytes Absolute 0.5 0.1 - 0.9 x10E3/uL   EOS (ABSOLUTE) 0.2 0.0 - 0.4 x10E3/uL   Basophils Absolute 0.0 0.0 - 0.2 x10E3/uL   Immature Granulocytes 0 Not Estab. %   Immature Grans (Abs) 0.0 0.0 - 0.1 x10E3/uL  Comprehensive metabolic panel  Result Value Ref Range   Glucose  88 65 - 99 mg/dL   BUN 9 8 - 27 mg/dL   Creatinine, Ser 0.88 0.76 - 1.27 mg/dL   GFR calc non Af Amer 93 >59 mL/min/1.73   GFR calc Af Amer 108 >59 mL/min/1.73   BUN/Creatinine Ratio 10 10 - 24   Sodium 141 134 - 144 mmol/L   Potassium 4.1 3.5 - 5.2 mmol/L   Chloride 101 96 - 106 mmol/L   CO2 25 20 - 29 mmol/L   Calcium 9.1 8.6 - 10.2 mg/dL   Total Protein 6.9 6.0 - 8.5 g/dL   Albumin 4.7 3.6 - 4.8 g/dL   Globulin, Total 2.2 1.5 - 4.5 g/dL   Albumin/Globulin Ratio 2.1 1.2 - 2.2   Bilirubin Total 1.0 0.0 - 1.2 mg/dL   Alkaline Phosphatase 67 39 - 117 IU/L   AST 26 0 - 40 IU/L   ALT 34 0 - 44 IU/L  Lipid Panel w/o Chol/HDL Ratio  Result Value Ref Range   Cholesterol, Total 136 100 - 199 mg/dL   Triglycerides 172 (H) 0 - 149 mg/dL   HDL 44 >39 mg/dL   VLDL Cholesterol Cal 34 5 - 40 mg/dL   LDL Calculated 58 0 - 99 mg/dL  PSA  Result Value Ref Range   Prostate Specific Ag, Serum 1.2 0.0 - 4.0 ng/mL  Microalbumin, Urine Waived  Result Value Ref Range   Microalb, Ur Waived 80 (H) 0 - 19 mg/L   Creatinine, Urine Waived 100 10 - 300 mg/dL   Microalb/Creat Ratio 30-300 (H) <30 mg/g  Uric acid  Result Value Ref Range   Uric Acid 5.4 3.7 - 8.6 mg/dL      Assessment & Plan:   Problem List Items Addressed This Visit      Cardiovascular and Mediastinum    Essential hypertension, benign    Chronic, stable on current medication regimen.  Continue current regimen.        Other   Hyperlipidemia    Chronic, ongoing.  Lipid panel today.  Last ASCVD score 7.7% with TCHOL 136.      Obesity (BMI 30.0-34.9)    Chronic, continues to focus on exercise.  Encouraged eating heart healthy diet.         Other Visit Diagnoses    Flu vaccine need    -  Primary   Relevant Orders   Ambulatory referral to Dermatology   Flu Vaccine QUAD 36+ mos IM (Completed)       Follow up plan: Return in about 6 months (around 09/19/2018) for HTN.

## 2018-03-20 NOTE — Patient Instructions (Signed)

## 2018-03-21 LAB — COMPREHENSIVE METABOLIC PANEL
A/G RATIO: 1.9 (ref 1.2–2.2)
ALK PHOS: 66 IU/L (ref 39–117)
ALT: 31 IU/L (ref 0–44)
AST: 19 IU/L (ref 0–40)
Albumin: 4.4 g/dL (ref 3.6–4.8)
BUN/Creatinine Ratio: 9 — ABNORMAL LOW (ref 10–24)
BUN: 8 mg/dL (ref 8–27)
Bilirubin Total: 1 mg/dL (ref 0.0–1.2)
CALCIUM: 9.1 mg/dL (ref 8.6–10.2)
CO2: 24 mmol/L (ref 20–29)
Chloride: 103 mmol/L (ref 96–106)
Creatinine, Ser: 0.93 mg/dL (ref 0.76–1.27)
GFR calc Af Amer: 103 mL/min/{1.73_m2} (ref >59)
GFR calc non Af Amer: 89 mL/min/{1.73_m2} (ref >59)
GLUCOSE: 92 mg/dL (ref 65–99)
Globulin, Total: 2.3 g/dL (ref 1.5–4.5)
POTASSIUM: 4 mmol/L (ref 3.5–5.2)
SODIUM: 138 mmol/L (ref 134–144)
Total Protein: 6.7 g/dL (ref 6.0–8.5)

## 2018-03-21 LAB — CBC
Hematocrit: 46.5 % (ref 37.5–51.0)
Hemoglobin: 16.1 g/dL (ref 13.0–17.7)
MCH: 29.3 pg (ref 26.6–33.0)
MCHC: 34.6 g/dL (ref 31.5–35.7)
MCV: 85 fL (ref 79–97)
Platelets: 170 10*3/uL (ref 150–450)
RBC: 5.5 x10E6/uL (ref 4.14–5.80)
RDW: 12.9 % (ref 12.3–15.4)
WBC: 6.4 10*3/uL (ref 3.4–10.8)

## 2018-03-21 LAB — LIPID PANEL W/O CHOL/HDL RATIO
CHOLESTEROL TOTAL: 160 mg/dL (ref 100–199)
HDL: 43 mg/dL (ref >39)
LDL Calculated: 86 mg/dL (ref 0–99)
Triglycerides: 153 mg/dL — ABNORMAL HIGH (ref 0–149)
VLDL CHOLESTEROL CAL: 31 mg/dL (ref 5–40)

## 2018-03-21 LAB — TSH: TSH: 1.7 u[IU]/mL (ref 0.450–4.500)

## 2018-03-28 ENCOUNTER — Other Ambulatory Visit: Payer: Self-pay | Admitting: Unknown Physician Specialty

## 2018-03-28 DIAGNOSIS — I10 Essential (primary) hypertension: Secondary | ICD-10-CM

## 2018-03-28 NOTE — Telephone Encounter (Signed)
Requested Prescriptions  Pending Prescriptions Disp Refills  . lisinopril (PRINIVIL,ZESTRIL) 2.5 MG tablet [Pharmacy Med Name: LISINOPRIL 2.5 MG TABLET] 90 tablet 1    Sig: TAKE 1 TABLET (2.5 MG TOTAL) BY MOUTH DAILY.     Cardiovascular:  ACE Inhibitors Passed - 03/28/2018  2:07 AM      Passed - Cr in normal range and within 180 days    Creatinine, Ser  Date Value Ref Range Status  03/20/2018 0.93 0.76 - 1.27 mg/dL Final         Passed - K in normal range and within 180 days    Potassium  Date Value Ref Range Status  03/20/2018 4.0 3.5 - 5.2 mmol/L Final         Passed - Patient is not pregnant      Passed - Last BP in normal range    BP Readings from Last 1 Encounters:  03/20/18 127/82         Passed - Valid encounter within last 6 months    Recent Outpatient Visits          1 week ago Flu vaccine need   [facility] [place], Barbaraann Faster, NP   6 months ago Annual physical exam   [facility] Kathrine Haddock, NP   1 year ago Need for influenza vaccination   [facility] Kathrine Haddock, NP   1 year ago Erythema migrans (Lyme disease)   [place] Kathrine Haddock, NP   2 years ago Pure hypercholesterolemia   [facility] Kathrine Haddock, NP      Future Appointments            In 5 months [place], Barbaraann Faster, NP [employer], [facility]

## 2018-04-14 ENCOUNTER — Other Ambulatory Visit: Payer: Self-pay | Admitting: Unknown Physician Specialty

## 2018-04-14 DIAGNOSIS — E7800 Pure hypercholesterolemia, unspecified: Secondary | ICD-10-CM

## 2018-04-14 NOTE — Telephone Encounter (Signed)
Requested Prescriptions  Pending Prescriptions Disp Refills  . atorvastatin (LIPITOR) 20 MG tablet [Pharmacy Med Name: ATORVASTATIN 20 MG TABLET] 90 tablet 1    Sig: TAKE 1 TABLET (20 MG TOTAL) BY MOUTH DAILY.     Cardiovascular:  Antilipid - Statins Failed - 04/14/2018  2:56 AM      Failed - Triglycerides in normal range and within 360 days    Triglycerides  Date Value Ref Range Status  03/20/2018 153 (H) 0 - 149 mg/dL Final   Triglycerides Piccolo,Waived  Date Value Ref Range Status  08/26/2015 118 <150 mg/dL Final    Comment:                            Normal                   <150                         Borderline High     150 - 199                         High                200 - 499                         Very High                >499          Passed - Total Cholesterol in normal range and within 360 days    Cholesterol, Total  Date Value Ref Range Status  03/20/2018 160 100 - 199 mg/dL Final   Cholesterol Piccolo, Waived  Date Value Ref Range Status  08/26/2015 159 <200 mg/dL Final    Comment:                            Desirable                <200                         Borderline High      200- 239                         High                     >239          Passed - LDL in normal range and within 360 days    LDL Calculated  Date Value Ref Range Status  03/20/2018 86 0 - 99 mg/dL Final         Passed - HDL in normal range and within 360 days    HDL  Date Value Ref Range Status  03/20/2018 43 >39 mg/dL Final         Passed - Patient is not pregnant      Passed - Valid encounter within last 12 months    Recent Outpatient Visits          3 weeks ago Flu vaccine need   [employer], Barbaraann Faster, NP   7 months ago Annual physical exam   [facility] Kathrine Haddock, NP  1 year ago Need for influenza vaccination   [facility] Kathrine Haddock, NP   1 year ago Erythema migrans (Lyme disease)   [facility] Kathrine Haddock, NP   2 years ago Pure hypercholesterolemia   [place], NP      Future Appointments            In 5 months Venita Lick, NP [facility]         refilled until next appointment 09/19/18

## 2018-09-18 ENCOUNTER — Telehealth: Payer: Self-pay | Admitting: Nurse Practitioner

## 2018-09-18 NOTE — Telephone Encounter (Signed)
Called pt to reschedule appt due to [facility] being out this week.

## 2018-09-19 ENCOUNTER — Ambulatory Visit: Payer: BC Managed Care – PPO | Admitting: Nurse Practitioner

## 2018-09-25 ENCOUNTER — Other Ambulatory Visit: Payer: Self-pay | Admitting: Unknown Physician Specialty

## 2018-09-25 DIAGNOSIS — I10 Essential (primary) hypertension: Secondary | ICD-10-CM

## 2018-10-06 ENCOUNTER — Other Ambulatory Visit: Payer: Self-pay

## 2018-10-06 ENCOUNTER — Ambulatory Visit: Payer: BC Managed Care – PPO | Admitting: Nurse Practitioner

## 2018-10-06 ENCOUNTER — Encounter: Payer: Self-pay | Admitting: Nurse Practitioner

## 2018-10-06 VITALS — BP 122/78 | HR 74 | Temp 98.4°F | Ht 66.0 in | Wt 198.0 lb

## 2018-10-06 DIAGNOSIS — E66811 Obesity, class 1: Secondary | ICD-10-CM

## 2018-10-06 DIAGNOSIS — E7800 Pure hypercholesterolemia, unspecified: Secondary | ICD-10-CM

## 2018-10-06 DIAGNOSIS — M545 Low back pain, unspecified: Secondary | ICD-10-CM

## 2018-10-06 DIAGNOSIS — G8929 Other chronic pain: Secondary | ICD-10-CM

## 2018-10-06 DIAGNOSIS — R7303 Prediabetes: Secondary | ICD-10-CM

## 2018-10-06 DIAGNOSIS — R7301 Impaired fasting glucose: Secondary | ICD-10-CM | POA: Insufficient documentation

## 2018-10-06 DIAGNOSIS — Z23 Encounter for immunization: Secondary | ICD-10-CM

## 2018-10-06 DIAGNOSIS — E669 Obesity, unspecified: Secondary | ICD-10-CM

## 2018-10-06 DIAGNOSIS — I10 Essential (primary) hypertension: Secondary | ICD-10-CM | POA: Diagnosis not present

## 2018-10-06 DIAGNOSIS — Z125 Encounter for screening for malignant neoplasm of prostate: Secondary | ICD-10-CM

## 2018-10-06 LAB — MICROALBUMIN, URINE WAIVED
Creatinine, Urine Waived: 50 mg/dL (ref 10–300)
Microalb, Ur Waived: 80 mg/L — ABNORMAL HIGH (ref 0–19)
Microalb/Creat Ratio: >300 mg/g — ABNORMAL HIGH (ref <30)

## 2018-10-06 LAB — BAYER DCA HB A1C WAIVED: HB A1C (BAYER DCA - WAIVED): 6.1 % (ref <7.0)

## 2018-10-06 NOTE — Progress Notes (Signed)
BP 122/78   Pulse 74   Temp 98.4 F (36.9 C) (Oral)   Ht 5\' 6"  (1.676 m)   Wt 198 lb (89.8 kg)   SpO2 99%   BMI 31.96 kg/m    Subjective:    Patient ID: Colton Brown, male    DOB: Aug 13, 1955, 63 y.o.   MRN: [id number]  HPI: Colton Brown is a 63 y.o. male presenting on 10/06/2018 for comprehensive medical examination. Current medical complaints include:none  He currently lives with: divorced Interim Problems from his last visit: no  Functional Status Survey: Is the patient deaf or have difficulty hearing?: No Does the patient have difficulty seeing, even when wearing glasses/contacts?: No Does the patient have difficulty concentrating, remembering, or making decisions?: No Does the patient have difficulty walking or climbing stairs?: No Does the patient have difficulty dressing or bathing?: No Does the patient have difficulty doing errands alone such as visiting a doctor's office or shopping?: No   HYPERTENSION / Rosita Satisfied with current treatment? yes Duration of hypertension: chronic BP monitoring frequency: not checking BP range:  BP medication side effects: no Duration of hyperlipidemia: chronic Cholesterol medication side effects: no Cholesterol supplements: none Medication compliance: good compliance Aspirin: yes Recent stressors: no Recurrent headaches: no Visual changes: no Palpitations: no Dyspnea: no Chest pain: no Lower extremity edema: no Dizzy/lightheaded: no   PREDIABETES: His A1C was 5.7% in October 2019.  No current medications.  Denies polyuria, polydipsia, or polyphagia.  Does report strong family history of diabetes.  Educated on Metformin.  He does not wish to start at this time, wishes to focus on diet and exercise regimen.  LUMBAGO: History of surgery L4-L5 at [facility] due to herniated disc, has herniated disc above this are too he reports.  Has herniated disc at C6-C7.  Currently he does nothing for pain, but he does get massages and  uses Tumeric.  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  Is pondering neurosurgery visit.  States at next visit he may request referral and is agreeable to repeat MRI prior to visit with them.  Discussed trial of Gabapentin for sciatica, is not interested at this time.    FALL RISK: Fall Risk  10/06/2018 09/16/2017 09/14/2016  Falls in the past year? 0 No No  Number falls in past yr: 0 - -  Injury with Fall? 0 - -    Depression Screen Depression screen [facility] 2/9 10/06/2018 09/16/2017 09/14/2016  Decreased Interest 0 0 0  Down, Depressed, Hopeless 0 0 0  PHQ - 2 Score 0 0 0  Altered sleeping 3 - -  Tired, decreased energy 0 - -  Change in appetite 0 - -  Feeling bad or failure about yourself  0 - -  Trouble concentrating 0 - -  Moving slowly or fidgety/restless 0 - -  Suicidal thoughts 0 - -  PHQ-9 Score 3 - -   GAD 7 : Generalized Anxiety Score 10/06/2018  Nervous, Anxious, on Edge 0  Control/stop worrying 0  Worry too much - different things 0  Trouble relaxing 0  Restless 0  Easily annoyed or irritable 0  Afraid - awful might happen 0  Total GAD 7 Score 0  Anxiety Difficulty Not difficult at all    Past Medical History:  Past Medical History:  Diagnosis Date  . 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  GERD (gastroesophageal reflux disease)    DUE TO GALLBLADDER-NO MEDS  . Hyperlipidemia   . Lumbago   . Psoriasis     Surgical History:  Past Surgical History:  Procedure Laterality Date  . ANKLE SURGERY Left 1989  . APPENDECTOMY  1979  . CHOLECYSTECTOMY N/A 11/11/2015   Procedure: LAPAROSCOPIC CHOLECYSTECTOMY WITH INTRAOPERATIVE CHOLANGIOGRAM;  Surgeon: Hubbard Robinson, MD;  Location: [facility] ORS;  Service: General;  Laterality: N/A;  . COLONOSCOPY WITH PROPOFOL N/A 11/02/2016   Procedure: COLONOSCOPY WITH PROPOFOL;  Surgeon: Lucilla Lame, MD;  Location: [facility]  ENDOSCOPY;  Service: Endoscopy;  Laterality: N/A;  . LUMBAR DISC SURGERY     L4-L5  . WISDOM TOOTH EXTRACTION      Medications:  Current Outpatient Medications on File Prior to Visit  Medication Sig  . aspirin 81 MG tablet Take 81 mg by mouth daily.  Marland Kitchen atorvastatin (LIPITOR) 20 MG tablet TAKE 1 TABLET (20 MG TOTAL) BY MOUTH DAILY.  Marland Kitchen lisinopril (ZESTRIL) 2.5 MG tablet TAKE 1 TABLET (2.5 MG TOTAL) BY MOUTH DAILY.  . Turmeric 500 MG CAPS Take 1 capsule by mouth daily.   No current facility-administered medications on file prior to visit.     Allergies:  No Known Allergies  Social History:  Social History   Socioeconomic History  . Marital status: Divorced    Spouse name: Not on file  . Number of children: Not on file  . Years of education: Not on file  . Highest education level: Not on file  Occupational History  . Not on file  Social Needs  . Financial resource strain: Not on file  . Food insecurity:    Worry: Not on file    Inability: Not on file  . Transportation needs:    Medical: Not on file    Non-medical: Not on file  Tobacco Use  . Smoking status: Never Smoker  . Smokeless tobacco: Never Used  Substance and Sexual Activity  . Alcohol use: No    Alcohol/week: 0.0 standard drinks  . Drug use: No  . Sexual activity: Yes  Lifestyle  . Physical activity:    Days per week: Not on file    Minutes per session: Not on file  . Stress: Not on file  Relationships  . Social connections:    Talks on phone: Not on file    Gets together: Not on file    Attends religious service: Not on file    Active member of club or organization: Not on file    Attends meetings of clubs or organizations: Not on file    Relationship status: Not on file  . Intimate partner violence:    Fear of current or ex partner: Not on file    Emotionally abused: Not on file    Physically abused: Not on file    Forced sexual activity: Not on file  Other Topics Concern  . Not on file   Social History Narrative  . Not on file   Social History   Tobacco Use  Smoking Status Never Smoker  Smokeless Tobacco Never Used   Social History   Substance and Sexual Activity  Alcohol Use No  . Alcohol/week: 0.0 standard drinks    Family History:  Family History  Problem Relation Age of Onset  . Asthma Mother   . Stroke Mother   . Migraines Mother   . Thyroid disease Mother   . Diabetes Mother   . Heart disease Father   . Hyperlipidemia  Father   . Hypertension Father   . Stroke Father   . Hyperlipidemia Brother   . Hypertension Brother   . Diabetes Maternal Grandmother   . Diabetes Maternal Grandfather   . Heart disease Paternal Grandmother     Past medical history, surgical history, medications, allergies, family history and social history reviewed with patient today and changes made to appropriate areas of the chart.   Review of Systems - Negative All other ROS negative except what is listed above and in the HPI.      Objective:    BP 122/78   Pulse 74   Temp 98.4 F (36.9 C) (Oral)   Ht 5\' 6"  (1.676 m)   Wt 198 lb (89.8 kg)   SpO2 99%   BMI 31.96 kg/m   Wt Readings from Last 3 Encounters:  10/06/18 198 lb (89.8 kg)  03/20/18 198 lb 3 oz (89.9 kg)  09/16/17 196 lb 11.2 oz (89.2 kg)    Physical Exam Vitals signs and nursing note reviewed.  Constitutional:      Appearance: He is well-developed.  HENT:     Head: Normocephalic and atraumatic.     Right Ear: Hearing normal. No drainage.     Left Ear: Hearing normal. No drainage.     Mouth/Throat:     Pharynx: Uvula midline.  Eyes:     General: Lids are normal.        Right eye: No discharge.        Left eye: No discharge.     Conjunctiva/sclera: Conjunctivae normal.     Pupils: Pupils are equal, round, and reactive to light.  Neck:     Musculoskeletal: Normal range of motion and neck supple.     Thyroid: No thyromegaly.     Vascular: No carotid bruit or JVD.     Trachea: Trachea normal.   Cardiovascular:     Rate and Rhythm: Normal rate and regular rhythm.     Heart sounds: Normal heart sounds, S1 normal and S2 normal. No murmur. No gallop.   Pulmonary:     Effort: Pulmonary effort is normal.     Breath sounds: Normal breath sounds.  Abdominal:     General: Bowel sounds are normal.     Palpations: Abdomen is soft. There is no hepatomegaly or splenomegaly.  Musculoskeletal: Normal range of motion.     Right lower leg: No edema.     Left lower leg: No edema.  Skin:    General: Skin is warm and dry.     Capillary Refill: Capillary refill takes less than 2 seconds.     Findings: No rash.  Neurological:     Mental Status: He is alert and oriented to person, place, and time.     Deep Tendon Reflexes: Reflexes are normal and symmetric.  Psychiatric:        Mood and Affect: Mood normal.        Behavior: Behavior normal.        Thought Content: Thought content normal.        Judgment: Judgment normal.     Results for orders placed or performed in visit on 10/06/18  Microalbumin, Urine Waived  Result Value Ref Range   Microalb, Ur Waived 80 (H) 0 - 19 mg/L   Creatinine, Urine Waived 50 10 - 300 mg/dL   Microalb/Creat Ratio >300 (H) <30 mg/g  Bayer DCA Hb A1c Waived  Result Value Ref Range   HB A1C (BAYER DCA - WAIVED)  6.1 <7.0 %      Assessment & Plan:   Problem List Items Addressed This Visit      Cardiovascular and Mediastinum   Essential hypertension, benign - Primary    Chronic, stable.  BP below goal.  Continue current medication regimen.  Labs today.      Relevant Orders   Comprehensive metabolic panel   CBC with Differential/Platelet     Other   Hyperlipidemia    Chronic, ongoing.  Continue current medication regimen.  Lipid panel and CMP today, adjust dose as needed.      Lumbago    Chronic.  Recommend continue Tylenol and massage as needed.  Will readdress next visit, as patient may like to visit with neurosurgeon and have repeat MRI.         Obesity (BMI 30.0-34.9)    Recommend continued focus on health diet choices and regular physical activity (30 minutes 5 days a week).      Prediabetes    Repeat A1C today.      Relevant Orders   Lipid Panel w/o Chol/HDL Ratio   Microalbumin, Urine Waived (Completed)   Bayer DCA Hb A1c Waived (Completed)    Other Visit Diagnoses    Prostate cancer screening       PSA per patient request.   Relevant Orders   PSA   Need for diphtheria-tetanus-pertussis (Tdap) vaccine       Relevant Orders   Tdap vaccine greater than or equal to 7yo IM (Completed)       Discussed aspirin prophylaxis for myocardial infarction prevention and decision was that we recommended ASA, and patient refused  LABORATORY TESTING:  Health maintenance labs ordered today as discussed above.   The natural history of prostate cancer and ongoing controversy regarding screening and potential treatment outcomes of prostate cancer has been discussed with the patient. The meaning of a false positive PSA and a false negative PSA has been discussed. He indicates understanding of the limitations of this screening test and wishes to proceed with screening PSA testing.   IMMUNIZATIONS:   - Tdap: Tetanus vaccination status reviewed: Td vaccination indicated and given today. - Influenza: Up to date - Pneumovax: Not applicable - Prevnar: Not applicable - Zostavax vaccine: Refused  SCREENING: - Colonoscopy: Up to date  Discussed with patient purpose of the colonoscopy is to detect colon cancer at curable precancerous or early stages   - AAA Screening: Not applicable  -Hearing Test: Not applicable  -Spirometry: Not applicable   PATIENT COUNSELING:    Sexuality: Discussed sexually transmitted diseases, partner selection, use of condoms, avoidance of unintended pregnancy  and contraceptive alternatives.   Advised to avoid cigarette smoking.  I discussed with the patient that most people either abstain from alcohol or  drink within safe limits (<=14/week and <=4 drinks/occasion for males, <=7/weeks and <= 3 drinks/occasion for females) and that the risk for alcohol disorders and other health effects rises proportionally with the number of drinks per week and how often a drinker exceeds daily limits.  Discussed cessation/primary prevention of drug use and availability of treatment for abuse.   Diet: Encouraged to adjust caloric intake to maintain  or achieve ideal body weight, to reduce intake of dietary saturated fat and total fat, to limit sodium intake by avoiding high sodium foods and not adding table salt, and to maintain adequate dietary potassium and calcium preferably from fresh fruits, vegetables, and low-fat dairy products.    stressed the importance of regular exercise  Injury prevention: Discussed safety belts, safety helmets, smoke detector, smoking near bedding or upholstery.   Dental health: Discussed importance of regular tooth brushing, flossing, and dental visits.   Follow up plan: NEXT PREVENTATIVE PHYSICAL DUE IN 1 YEAR. Return in about 3 months (around 01/06/2019) for Prediabets and back pain.

## 2018-10-06 NOTE — Patient Instructions (Signed)
Carbohydrate Counting for Diabetes Mellitus, Adult  Carbohydrate counting is a method of keeping track of how many carbohydrates you eat. Eating carbohydrates naturally increases the amount of sugar (glucose) in the blood. Counting how many carbohydrates you eat helps keep your blood glucose within normal limits, which helps you manage your diabetes (diabetes mellitus). It is important to know how many carbohydrates you can safely have in each meal. This is different for every person. A diet and nutrition specialist (registered dietitian) can help you make a meal plan and calculate how many carbohydrates you should have at each meal and snack. Carbohydrates are found in the following foods:  Grains, such as breads and cereals.  Dried beans and soy products.  Starchy vegetables, such as potatoes, peas, and corn.  Fruit and fruit juices.  Milk and yogurt.  Sweets and snack foods, such as cake, cookies, candy, chips, and soft drinks. How do I count carbohydrates? There are two ways to count carbohydrates in food. You can use either of the methods or a combination of both. Reading "Nutrition Facts" on packaged food The "Nutrition Facts" list is included on the labels of almost all packaged foods and beverages in the [place] It includes:  The serving size.  Information about nutrients in each serving, including the grams (g) of carbohydrate per serving. To use the "Nutrition Facts":  Decide how many servings you will have.  Multiply the number of servings by the number of carbohydrates per serving.  The resulting number is the total amount of carbohydrates that you will be having. Learning standard serving sizes of other foods When you eat carbohydrate foods that are not packaged or do not include "Nutrition Facts" on the label, you need to measure the servings in order to count the amount of carbohydrates:  Measure the foods that you will eat with a food scale or measuring cup, if needed.   Decide how many standard-size servings you will eat.  Multiply the number of servings by 15. Most carbohydrate-rich foods have about 15 g of carbohydrates per serving. ? For example, if you eat 8 oz (170 g) of strawberries, you will have eaten 2 servings and 30 g of carbohydrates (2 servings x 15 g = 30 g).  For foods that have more than one food mixed, such as soups and casseroles, you must count the carbohydrates in each food that is included. The following list contains standard serving sizes of common carbohydrate-rich foods. Each of these servings has about 15 g of carbohydrates:   hamburger bun or  English muffin.   oz (15 mL) syrup.   oz (14 g) jelly.  1 slice of bread.  1 six-inch tortilla.  3 oz (85 g) cooked rice or pasta.  4 oz (113 g) cooked dried beans.  4 oz (113 g) starchy vegetable, such as peas, corn, or potatoes.  4 oz (113 g) hot cereal.  4 oz (113 g) mashed potatoes or  of a large baked potato.  4 oz (113 g) canned or frozen fruit.  4 oz (120 mL) fruit juice.  4-6 crackers.  6 chicken nuggets.  6 oz (170 g) unsweetened dry cereal.  6 oz (170 g) plain fat-free yogurt or yogurt sweetened with artificial sweeteners.  8 oz (240 mL) milk.  8 oz (170 g) fresh fruit or one small piece of fruit.  24 oz (680 g) popped popcorn. Example of carbohydrate counting Sample meal  3 oz (85 g) chicken breast.  6 oz (170 g)   brown rice.  4 oz (113 g) corn.  8 oz (240 mL) milk.  8 oz (170 g) strawberries with sugar-free whipped topping. Carbohydrate calculation 1. Identify the foods that contain carbohydrates: ? Rice. ? Corn. ? Milk. ? Strawberries. 2. Calculate how many servings you have of each food: ? 2 servings rice. ? 1 serving corn. ? 1 serving milk. ? 1 serving strawberries. 3. Multiply each number of servings by 15 g: ? 2 servings rice x 15 g = 30 g. ? 1 serving corn x 15 g = 15 g. ? 1 serving milk x 15 g = 15 g. ? 1 serving  strawberries x 15 g = 15 g. 4. Add together all of the amounts to find the total grams of carbohydrates eaten: ? 30 g + 15 g + 15 g + 15 g = 75 g of carbohydrates total. Summary  Carbohydrate counting is a method of keeping track of how many carbohydrates you eat.  Eating carbohydrates naturally increases the amount of sugar (glucose) in the blood.  Counting how many carbohydrates you eat helps keep your blood glucose within normal limits, which helps you manage your diabetes.  A diet and nutrition specialist (registered dietitian) can help you make a meal plan and calculate how many carbohydrates you should have at each meal and snack. This information is not intended to replace advice given to you by your health care provider. Make sure you discuss any questions you have with your health care provider. Document Released: 05/10/2005 Document Revised: 11/17/2016 Document Reviewed: 10/22/2015 Elsevier Interactive Patient Education  2019 Elsevier Inc.  

## 2018-10-06 NOTE — Assessment & Plan Note (Signed)
Chronic.  Recommend continue Tylenol and massage as needed.  Will readdress next visit, as patient may like to visit with neurosurgeon and have repeat MRI.

## 2018-10-06 NOTE — Assessment & Plan Note (Signed)
-   Repeat A1C today

## 2018-10-06 NOTE — Assessment & Plan Note (Addendum)
Chronic, stable.  BP below goal.  Continue current medication regimen.  Labs today.

## 2018-10-06 NOTE — Assessment & Plan Note (Signed)
Recommend continued focus on health diet choices and regular physical activity (30 minutes 5 days a week). 

## 2018-10-06 NOTE — Assessment & Plan Note (Signed)
Chronic, ongoing.  Continue current medication regimen.  Lipid panel and CMP today, adjust dose as needed.

## 2018-10-07 LAB — CBC WITH DIFFERENTIAL/PLATELET
Basophils Absolute: 0 10*3/uL (ref 0.0–0.2)
Basos: 1 %
EOS (ABSOLUTE): 0.2 10*3/uL (ref 0.0–0.4)
Eos: 3 %
Hematocrit: 46 % (ref 37.5–51.0)
Hemoglobin: 16.2 g/dL (ref 13.0–17.7)
Immature Grans (Abs): 0 10*3/uL (ref 0.0–0.1)
Immature Granulocytes: 1 %
Lymphocytes Absolute: 1.7 10*3/uL (ref 0.7–3.1)
Lymphs: 27 %
MCH: 30.9 pg (ref 26.6–33.0)
MCHC: 35.2 g/dL (ref 31.5–35.7)
MCV: 88 fL (ref 79–97)
Monocytes Absolute: 0.6 10*3/uL (ref 0.1–0.9)
Monocytes: 9 %
Neutrophils Absolute: 3.8 10*3/uL (ref 1.4–7.0)
Neutrophils: 59 %
Platelets: 146 10*3/uL — ABNORMAL LOW (ref 150–450)
RBC: 5.25 x10E6/uL (ref 4.14–5.80)
RDW: 13 % (ref 11.6–15.4)
WBC: 6.4 10*3/uL (ref 3.4–10.8)

## 2018-10-07 LAB — COMPREHENSIVE METABOLIC PANEL
ALT: 41 IU/L (ref 0–44)
AST: 20 IU/L (ref 0–40)
Albumin/Globulin Ratio: 2.5 — ABNORMAL HIGH (ref 1.2–2.2)
Albumin: 4.9 g/dL — ABNORMAL HIGH (ref 3.8–4.8)
Alkaline Phosphatase: 70 IU/L (ref 39–117)
BUN/Creatinine Ratio: 9 — ABNORMAL LOW (ref 10–24)
BUN: 8 mg/dL (ref 8–27)
Bilirubin Total: 1.2 mg/dL (ref 0.0–1.2)
CO2: 24 mmol/L (ref 20–29)
Calcium: 9.3 mg/dL (ref 8.6–10.2)
Chloride: 99 mmol/L (ref 96–106)
Creatinine, Ser: 0.86 mg/dL (ref 0.76–1.27)
GFR calc Af Amer: 108 mL/min/{1.73_m2} (ref >59)
GFR calc non Af Amer: 94 mL/min/{1.73_m2} (ref >59)
Globulin, Total: 2 g/dL (ref 1.5–4.5)
Glucose: 88 mg/dL (ref 65–99)
Potassium: 4.1 mmol/L (ref 3.5–5.2)
Sodium: 141 mmol/L (ref 134–144)
Total Protein: 6.9 g/dL (ref 6.0–8.5)

## 2018-10-07 LAB — LIPID PANEL W/O CHOL/HDL RATIO
Cholesterol, Total: 155 mg/dL (ref 100–199)
HDL: 45 mg/dL (ref >39)
LDL Calculated: 79 mg/dL (ref 0–99)
Triglycerides: 156 mg/dL — ABNORMAL HIGH (ref 0–149)
VLDL Cholesterol Cal: 31 mg/dL (ref 5–40)

## 2018-10-07 LAB — PSA: Prostate Specific Ag, Serum: 1.2 ng/mL (ref 0.0–4.0)

## 2018-10-09 ENCOUNTER — Telehealth: Payer: Self-pay | Admitting: Nurse Practitioner

## 2018-10-09 DIAGNOSIS — D696 Thrombocytopenia, unspecified: Secondary | ICD-10-CM

## 2018-10-09 NOTE — Telephone Encounter (Signed)
Spoke to patient via telephone and reviewed lab results.  Discussed that platelets were slightly on low side, would like to recheck these in one month.  At baseline since 2017 has run on lower side of normal 170-180.  Discussed low platelets with him and when we would refer to hematology.  He is aware at this time will monitor.  Discussed remaining labs with patient.

## 2018-10-10 ENCOUNTER — Other Ambulatory Visit: Payer: Self-pay | Admitting: Unknown Physician Specialty

## 2018-10-10 DIAGNOSIS — E7800 Pure hypercholesterolemia, unspecified: Secondary | ICD-10-CM

## 2018-10-17 ENCOUNTER — Other Ambulatory Visit: Payer: Self-pay | Admitting: Nurse Practitioner

## 2018-10-17 DIAGNOSIS — I10 Essential (primary) hypertension: Secondary | ICD-10-CM

## 2018-11-06 ENCOUNTER — Other Ambulatory Visit: Payer: Self-pay

## 2018-11-06 ENCOUNTER — Other Ambulatory Visit: Payer: BC Managed Care – PPO

## 2018-11-06 DIAGNOSIS — D696 Thrombocytopenia, unspecified: Secondary | ICD-10-CM

## 2018-11-07 LAB — CBC WITH DIFFERENTIAL/PLATELET
Basophils Absolute: 0 10*3/uL (ref 0.0–0.2)
Basos: 1 %
EOS (ABSOLUTE): 0.2 10*3/uL (ref 0.0–0.4)
Eos: 3 %
Hematocrit: 48.1 % (ref 37.5–51.0)
Hemoglobin: 16.3 g/dL (ref 13.0–17.7)
Immature Grans (Abs): 0 10*3/uL (ref 0.0–0.1)
Immature Granulocytes: 0 %
Lymphocytes Absolute: 1.4 10*3/uL (ref 0.7–3.1)
Lymphs: 24 %
MCH: 30.5 pg (ref 26.6–33.0)
MCHC: 33.9 g/dL (ref 31.5–35.7)
MCV: 90 fL (ref 79–97)
Monocytes Absolute: 0.5 10*3/uL (ref 0.1–0.9)
Monocytes: 9 %
Neutrophils Absolute: 3.7 10*3/uL (ref 1.4–7.0)
Neutrophils: 63 %
Platelets: 149 10*3/uL — ABNORMAL LOW (ref 150–450)
RBC: 5.34 x10E6/uL (ref 4.14–5.80)
RDW: 13.6 % (ref 11.6–15.4)
WBC: 5.8 10*3/uL (ref 3.4–10.8)

## 2018-11-13 ENCOUNTER — Other Ambulatory Visit: Payer: Self-pay | Admitting: Nurse Practitioner

## 2018-11-13 DIAGNOSIS — I10 Essential (primary) hypertension: Secondary | ICD-10-CM

## 2018-12-07 ENCOUNTER — Other Ambulatory Visit: Payer: Self-pay

## 2018-12-07 ENCOUNTER — Other Ambulatory Visit: Payer: Self-pay | Admitting: Nurse Practitioner

## 2018-12-07 ENCOUNTER — Other Ambulatory Visit: Payer: BC Managed Care – PPO

## 2018-12-07 DIAGNOSIS — D696 Thrombocytopenia, unspecified: Secondary | ICD-10-CM

## 2018-12-07 NOTE — Progress Notes (Signed)
Lab orders

## 2018-12-07 NOTE — Addendum Note (Signed)
Addended by: Marnee Guarneri T on: 12/07/2018 08:34 AM   Modules accepted: Orders

## 2018-12-08 ENCOUNTER — Other Ambulatory Visit: Payer: Self-pay | Admitting: Nurse Practitioner

## 2018-12-08 DIAGNOSIS — I10 Essential (primary) hypertension: Secondary | ICD-10-CM

## 2018-12-08 LAB — CBC WITH DIFFERENTIAL/PLATELET
Basophils Absolute: 0.1 10*3/uL (ref 0.0–0.2)
Basos: 1 %
EOS (ABSOLUTE): 0.2 10*3/uL (ref 0.0–0.4)
Eos: 3 %
Hematocrit: 49 % (ref 37.5–51.0)
Hemoglobin: 16.5 g/dL (ref 13.0–17.7)
Immature Grans (Abs): 0 10*3/uL (ref 0.0–0.1)
Immature Granulocytes: 0 %
Lymphocytes Absolute: 1.5 10*3/uL (ref 0.7–3.1)
Lymphs: 23 %
MCH: 30.3 pg (ref 26.6–33.0)
MCHC: 33.7 g/dL (ref 31.5–35.7)
MCV: 90 fL (ref 79–97)
Monocytes Absolute: 0.6 10*3/uL (ref 0.1–0.9)
Monocytes: 9 %
Neutrophils Absolute: 4 10*3/uL (ref 1.4–7.0)
Neutrophils: 64 %
Platelets: 156 10*3/uL (ref 150–450)
RBC: 5.45 x10E6/uL (ref 4.14–5.80)
RDW: 13.2 % (ref 11.6–15.4)
WBC: 6.4 10*3/uL (ref 3.4–10.8)

## 2018-12-15 ENCOUNTER — Encounter: Payer: BC Managed Care – PPO | Admitting: Nurse Practitioner

## 2019-01-09 ENCOUNTER — Encounter: Payer: Self-pay | Admitting: Nurse Practitioner

## 2019-01-09 ENCOUNTER — Ambulatory Visit: Payer: BC Managed Care – PPO | Admitting: Nurse Practitioner

## 2019-01-09 ENCOUNTER — Other Ambulatory Visit: Payer: Self-pay

## 2019-01-09 VITALS — BP 122/77 | HR 71 | Temp 98.5°F | Wt 189.4 lb

## 2019-01-09 DIAGNOSIS — R7303 Prediabetes: Secondary | ICD-10-CM | POA: Diagnosis not present

## 2019-01-09 DIAGNOSIS — I10 Essential (primary) hypertension: Secondary | ICD-10-CM

## 2019-01-09 DIAGNOSIS — E7800 Pure hypercholesterolemia, unspecified: Secondary | ICD-10-CM | POA: Diagnosis not present

## 2019-01-09 LAB — BAYER DCA HB A1C WAIVED: HB A1C (BAYER DCA - WAIVED): 5.4 % (ref <7.0)

## 2019-01-09 MED ORDER — LISINOPRIL 2.5 MG PO TABS: 2.5000 mg | ORAL_TABLET | Freq: Every day | ORAL | 3 refills | Status: DC

## 2019-01-09 MED ORDER — ATORVASTATIN CALCIUM 20 MG PO TABS: 20.0000 mg | ORAL_TABLET | Freq: Every day | ORAL | 3 refills | Status: DC

## 2019-01-09 NOTE — Assessment & Plan Note (Signed)
Chronic, stable with BP below goal.  Continue current medication regimen and adjust as needed.

## 2019-01-09 NOTE — Progress Notes (Signed)
BP 122/77   Pulse 71   Temp 98.5 F (36.9 C) (Oral)   Wt 189 lb 6.4 oz (85.9 kg)   SpO2 98%   BMI 30.57 kg/m    Subjective:    Patient ID: Colton Brown, male    DOB: [birth year], 63 y.o.   MRN: [id number]  HPI: Colton Brown is a 63 y.o. male  Chief Complaint  Patient presents with  . Diabetes    3 month f/up    PREDIABETES: May 2020 A1C 6.1%.  Current diet focus, has cut back on sweets and is walking 2 miles a day.  Has lost 11 pounds since last visit.   Hypoglycemic episodes:no Polydipsia/polyuria: no Visual disturbance: no Chest pain: no Paresthesias: no   HYPERTENSION / HYPERLIPIDEMIA Continues on Lisinopril and Atorvastatin + ASA. Satisfied with current treatment? yes Duration of hypertension: chronic BP monitoring frequency: not checking BP range:  BP medication side effects: no Duration of hyperlipidemia: chronic Cholesterol medication side effects: no Cholesterol supplements: none Medication compliance: good compliance Aspirin: yes Recent stressors: no Recurrent headaches: no Visual changes: no Palpitations: no Dyspnea: no Chest pain: no Lower extremity edema: no Dizzy/lightheaded: no  Relevant past medical, surgical, family and social history reviewed and updated as indicated. Interim medical history since our last visit reviewed. Allergies and medications reviewed and updated.  Review of Systems  Constitutional: Negative for activity change, diaphoresis, fatigue and fever.  Respiratory: Negative for cough, chest tightness, shortness of breath and wheezing.   Cardiovascular: Negative for chest pain, palpitations and leg swelling.  Gastrointestinal: Negative for abdominal distention, abdominal pain, constipation, diarrhea, nausea and vomiting.  Endocrine: Negative for cold intolerance, heat intolerance, polydipsia, polyphagia and polyuria.  Neurological: Negative for dizziness, syncope, weakness, light-headedness, numbness and headaches.   Psychiatric/Behavioral: Negative.     Per HPI unless specifically indicated above     Objective:    BP 122/77   Pulse 71   Temp 98.5 F (36.9 C) (Oral)   Wt 189 lb 6.4 oz (85.9 kg)   SpO2 98%   BMI 30.57 kg/m   Wt Readings from Last 3 Encounters:  01/09/19 189 lb 6.4 oz (85.9 kg)  10/06/18 198 lb (89.8 kg)  03/20/18 198 lb 3 oz (89.9 kg)    Physical Exam Vitals signs and nursing note reviewed.  Constitutional:      General: He is awake. He is not in acute distress.    Appearance: He is well-developed and overweight. He is not ill-appearing.  HENT:     Head: Normocephalic and atraumatic.     Right Ear: Hearing normal. No drainage.     Left Ear: Hearing normal. No drainage.  Eyes:     General: Lids are normal.        Right eye: No discharge.        Left eye: No discharge.     Conjunctiva/sclera: Conjunctivae normal.     Pupils: Pupils are equal, round, and reactive to light.  Neck:     Musculoskeletal: Normal range of motion and neck supple.     Thyroid: No thyromegaly.     Vascular: No carotid bruit.  Cardiovascular:     Rate and Rhythm: Normal rate and regular rhythm.     Heart sounds: Normal heart sounds, S1 normal and S2 normal. No murmur. No gallop.   Pulmonary:     Effort: Pulmonary effort is normal. No accessory muscle usage or respiratory distress.     Breath sounds: Normal breath sounds.  Abdominal:  General: Bowel sounds are normal.     Palpations: Abdomen is soft.  Musculoskeletal: Normal range of motion.     Right lower leg: No edema.     Left lower leg: No edema.  Skin:    General: Skin is warm and dry.  Neurological:     Mental Status: He is alert and oriented to person, place, and time.     Deep Tendon Reflexes: Reflexes are normal and symmetric.  Psychiatric:        Attention and Perception: Attention normal.        Mood and Affect: Mood normal.        Speech: Speech normal.        Behavior: Behavior normal. Behavior is cooperative.         Thought Content: Thought content normal.        Judgment: Judgment normal.     Results for orders placed or performed in visit on 12/07/18  CBC with Differential/Platelet  Result Value Ref Range   WBC 6.4 3.4 - 10.8 x10E3/uL   RBC 5.45 4.14 - 5.80 x10E6/uL   Hemoglobin 16.5 13.0 - 17.7 g/dL   Hematocrit 49.0 37.5 - 51.0 %   MCV 90 79 - 97 fL   MCH 30.3 26.6 - 33.0 pg   MCHC 33.7 31.5 - 35.7 g/dL   RDW 13.2 11.6 - 15.4 %   Platelets 156 150 - 450 x10E3/uL   Neutrophils 64 Not Estab. %   Lymphs 23 Not Estab. %   Monocytes 9 Not Estab. %   Eos 3 Not Estab. %   Basos 1 Not Estab. %   Neutrophils Absolute 4.0 1.4 - 7.0 x10E3/uL   Lymphocytes Absolute 1.5 0.7 - 3.1 x10E3/uL   Monocytes Absolute 0.6 0.1 - 0.9 x10E3/uL   EOS (ABSOLUTE) 0.2 0.0 - 0.4 x10E3/uL   Basophils Absolute 0.1 0.0 - 0.2 x10E3/uL   Immature Granulocytes 0 Not Estab. %   Immature Grans (Abs) 0.0 0.0 - 0.1 x10E3/uL      Assessment & Plan:   Problem List Items Addressed This Visit      Cardiovascular and Mediastinum   Essential hypertension, benign    Chronic, stable with BP below goal.  Continue current medication regimen and adjust as needed.        Relevant Medications   lisinopril (ZESTRIL) 2.5 MG tablet   atorvastatin (LIPITOR) 20 MG tablet     Other   Hyperlipidemia    Chronic, ongoing.  Continue current medication regimen and adjust as needed.  Continue diet focus.        Relevant Medications   lisinopril (ZESTRIL) 2.5 MG tablet   atorvastatin (LIPITOR) 20 MG tablet   Prediabetes - Primary    A1C today 5.4%, congratulated patient on success with diet changes and exercise.  Recommend continued focus on diet and exercise regimen at home.      Relevant Orders   Bayer DCA Hb A1c Waived       Follow up plan: Return in about 6 months (around 07/12/2019) for HLD/HTN, Prediabetes.

## 2019-01-09 NOTE — Assessment & Plan Note (Signed)
Chronic, ongoing.  Continue current medication regimen and adjust as needed.  Continue diet focus.

## 2019-01-09 NOTE — Patient Instructions (Signed)

## 2019-01-09 NOTE — Assessment & Plan Note (Signed)
A1C today 5.4%, congratulated patient on success with diet changes and exercise.  Recommend continued focus on diet and exercise regimen at home.

## 2019-07-24 ENCOUNTER — Encounter: Payer: Self-pay | Admitting: Nurse Practitioner

## 2019-07-24 ENCOUNTER — Telehealth (INDEPENDENT_AMBULATORY_CARE_PROVIDER_SITE_OTHER): Payer: BC Managed Care – PPO | Admitting: Nurse Practitioner

## 2019-07-24 ENCOUNTER — Ambulatory Visit: Payer: BC Managed Care – PPO | Admitting: Nurse Practitioner

## 2019-07-24 VITALS — BP 108/73 | HR 75 | Temp 98.3°F | Wt 190.6 lb

## 2019-07-24 DIAGNOSIS — Z125 Encounter for screening for malignant neoplasm of prostate: Secondary | ICD-10-CM | POA: Insufficient documentation

## 2019-07-24 DIAGNOSIS — I10 Essential (primary) hypertension: Secondary | ICD-10-CM | POA: Diagnosis not present

## 2019-07-24 DIAGNOSIS — E7800 Pure hypercholesterolemia, unspecified: Secondary | ICD-10-CM | POA: Diagnosis not present

## 2019-07-24 NOTE — Progress Notes (Signed)
BP 108/73   Pulse 75   Temp 98.3 F (36.8 C) (Oral)   Wt 190 lb 9.6 oz (86.5 kg)   BMI 30.76 kg/m    Subjective:    Patient ID: Colton Brown, male    DOB: 06-05-1955, 64 y.o.   MRN: [id number]  HPI: Colton Brown is a 64 y.o. male  Chief Complaint  Patient presents with  . Hyperlipidemia  . Hypertension    . This visit was completed via MyChart due to the restrictions of the COVID-19 pandemic. All issues as above were discussed and addressed. Physical exam was done as above through visual confirmation on MyChart. If it was felt that the patient should be evaluated in the office, they were directed there. The patient verbally consented to this visit.   . Location of the patient: home . Location of the provider: work . Those involved with this call:  . Provider: Marnee Guarneri, DNP . CMA: Yvonna Alanis, CMA . Front Desk/Registration: Don Perking  . Time spent on call: 15 minutes with patient face to face via video conference. More than 50% of this time was spent in counseling and coordination of care. 10 minutes total spent in review of patient's record and preparation of their chart.  . I verified patient identity using two factors (patient name and date of birth). Patient consents verbally to being seen via telemedicine visit today.    HYPERTENSION / HYPERLIPIDEMIA Continues on Lisinopril and Atorvastatin + ASA.  May 2020 labs noted TCHOL 155 and LDL 79. Satisfied with current treatment? yes Duration of hypertension: chronic BP monitoring frequency: occasional BP range: 108/73 this morning -- often in this range BP medication side effects: no Duration of hyperlipidemia: chronic Cholesterol medication side effects: no Cholesterol supplements: none Medication compliance: good compliance Aspirin: yes Recent stressors: no Recurrent headaches: no Visual changes: no Palpitations: no Dyspnea: no Chest pain: no Lower extremity edema: no Dizzy/lightheaded: no  PSA  CHECK: Last PSA in May 2020 was 1.2 -- has remained in this range for a few years.  Is requesting PSA check on outpatient labs.  Denies any symptoms.  Relevant past medical, surgical, family and social history reviewed and updated as indicated. Interim medical history since our last visit reviewed. Allergies and medications reviewed and updated.  Review of Systems  Constitutional: Negative for activity change, diaphoresis, fatigue and fever.  Respiratory: Negative for cough, chest tightness, shortness of breath and wheezing.   Cardiovascular: Negative for chest pain, palpitations and leg swelling.  Gastrointestinal: Negative.   Genitourinary: Negative.   Neurological: Negative.   Psychiatric/Behavioral: Negative.     Per HPI unless specifically indicated above     Objective:    BP 108/73   Pulse 75   Temp 98.3 F (36.8 C) (Oral)   Wt 190 lb 9.6 oz (86.5 kg)   BMI 30.76 kg/m   Wt Readings from Last 3 Encounters:  07/24/19 190 lb 9.6 oz (86.5 kg)  01/09/19 189 lb 6.4 oz (85.9 kg)  10/06/18 198 lb (89.8 kg)    Physical Exam Vitals and nursing note reviewed.  Constitutional:      General: He is awake. He is not in acute distress.    Appearance: He is well-developed. He is not ill-appearing.  HENT:     Head: Normocephalic.     Right Ear: Hearing normal. No drainage.     Left Ear: Hearing normal. No drainage.  Eyes:     General: Lids are normal.  Right eye: No discharge.        Left eye: No discharge.     Conjunctiva/sclera: Conjunctivae normal.  Pulmonary:     Effort: Pulmonary effort is normal. No accessory muscle usage or respiratory distress.  Musculoskeletal:     Cervical back: Normal range of motion.  Neurological:     Mental Status: He is alert and oriented to person, place, and time.  Psychiatric:        Mood and Affect: Mood normal.        Behavior: Behavior normal. Behavior is cooperative.        Thought Content: Thought content normal.         Judgment: Judgment normal.     Results for orders placed or performed in visit on 01/09/19  Bayer DCA Hb A1c Waived  Result Value Ref Range   HB A1C (BAYER DCA - WAIVED) 5.4 <7.0 %      Assessment & Plan:   Problem List Items Addressed This Visit      Cardiovascular and Mediastinum   Essential hypertension, benign - Primary    Chronic, stable with BP below goal.  Continue current medication regimen and adjust as needed.  Recommend checking BP at home at least a few mornings a week and documenting.  Check outpatient BMP.      Relevant Orders   Basic Metabolic Panel (BMET)     Other   Hyperlipidemia    Chronic, ongoing.  Continue current medication regimen and adjust as needed.  Continue diet focus.  Obtain outpatient lipid panel.      Relevant Orders   Lipid Panel Piccolo, Waived   Screening PSA (prostate specific antigen)    Has remained in normal range for age past few years.  Check outpatient PSA per patient request.      Relevant Orders   PSA      I discussed the assessment and treatment plan with the patient. The patient was provided an opportunity to ask questions and all were answered. The patient agreed with the plan and demonstrated an understanding of the instructions.   The patient was advised to call back or seek an in-person evaluation if the symptoms worsen or if the condition fails to improve as anticipated.   I provided 15+ minutes of time during this encounter.  Follow up plan: Return in about 5 months (around 12/24/2019) for Annual physical.

## 2019-07-24 NOTE — Progress Notes (Signed)
Lvm to make this 5 month physical

## 2019-07-24 NOTE — Assessment & Plan Note (Signed)
Has remained in normal range for age past few years.  Check outpatient PSA per patient request.

## 2019-07-24 NOTE — Assessment & Plan Note (Signed)
Chronic, ongoing.  Continue current medication regimen and adjust as needed.  Continue diet focus.  Obtain outpatient lipid panel.

## 2019-07-24 NOTE — Assessment & Plan Note (Signed)
Chronic, stable with BP below goal.  Continue current medication regimen and adjust as needed.  Recommend checking BP at home at least a few mornings a week and documenting.  Check outpatient BMP.

## 2019-07-24 NOTE — Patient Instructions (Signed)
DASH Eating Plan DASH stands for "Dietary Approaches to Stop Hypertension." The DASH eating plan is a healthy eating plan that has been shown to reduce high blood pressure (hypertension). It may also reduce your risk for type 2 diabetes, heart disease, and stroke. The DASH eating plan may also help with weight loss. What are tips for following this plan?  General guidelines  Avoid eating more than 2,300 mg (milligrams) of salt (sodium) a day. If you have hypertension, you may need to reduce your sodium intake to 1,500 mg a day.  Limit alcohol intake to no more than 1 drink a day for nonpregnant women and 2 drinks a day for men. One drink equals 12 oz of beer, 5 oz of wine, or 1 oz of hard liquor.  Work with your health care provider to maintain a healthy body weight or to lose weight. Ask what an ideal weight is for you.  Get at least 30 minutes of exercise that causes your heart to beat faster (aerobic exercise) most days of the week. Activities may include walking, swimming, or biking.  Work with your health care provider or diet and nutrition specialist (dietitian) to adjust your eating plan to your individual calorie needs. Reading food labels   Check food labels for the amount of sodium per serving. Choose foods with less than 5 percent of the Daily Value of sodium. Generally, foods with less than 300 mg of sodium per serving fit into this eating plan.  To find whole grains, look for the word "whole" as the first word in the ingredient list. Shopping  Buy products labeled as "low-sodium" or "no salt added."  Buy fresh foods. Avoid canned foods and premade or frozen meals. Cooking  Avoid adding salt when cooking. Use salt-free seasonings or herbs instead of table salt or sea salt. Check with your health care provider or pharmacist before using salt substitutes.  Do not fry foods. Cook foods using healthy methods such as baking, boiling, grilling, and broiling instead.  Cook with  heart-healthy oils, such as olive, canola, soybean, or sunflower oil. Meal planning  Eat a balanced diet that includes: ? 5 or more servings of fruits and vegetables each day. At each meal, try to fill half of your plate with fruits and vegetables. ? Up to 6-8 servings of whole grains each day. ? Less than 6 oz of lean meat, poultry, or fish each day. A 3-oz serving of meat is about the same size as a deck of cards. One egg equals 1 oz. ? 2 servings of low-fat dairy each day. ? A serving of nuts, seeds, or beans 5 times each week. ? Heart-healthy fats. Healthy fats called Omega-3 fatty acids are found in foods such as flaxseeds and coldwater fish, like sardines, salmon, and mackerel.  Limit how much you eat of the following: ? Canned or prepackaged foods. ? Food that is high in trans fat, such as fried foods. ? Food that is high in saturated fat, such as fatty meat. ? Sweets, desserts, sugary drinks, and other foods with added sugar. ? Full-fat dairy products.  Do not salt foods before eating.  Try to eat at least 2 vegetarian meals each week.  Eat more home-cooked food and less restaurant, buffet, and fast food.  When eating at a restaurant, ask that your food be prepared with less salt or no salt, if possible. What foods are recommended? The items listed may not be a complete list. Talk with your dietitian about   what dietary choices are best for you. Grains Whole-grain or whole-wheat bread. Whole-grain or whole-wheat pasta. Brown rice. Oatmeal. Quinoa. Bulgur. Whole-grain and low-sodium cereals. Pita bread. Low-fat, low-sodium crackers. Whole-wheat flour tortillas. Vegetables Fresh or frozen vegetables (raw, steamed, roasted, or grilled). Low-sodium or reduced-sodium tomato and vegetable juice. Low-sodium or reduced-sodium tomato sauce and tomato paste. Low-sodium or reduced-sodium canned vegetables. Fruits All fresh, dried, or frozen fruit. Canned fruit in natural juice (without  added sugar). Meat and other protein foods Skinless chicken or turkey. Ground chicken or turkey. Pork with fat trimmed off. Fish and seafood. Egg whites. Dried beans, peas, or lentils. Unsalted nuts, nut butters, and seeds. Unsalted canned beans. Lean cuts of beef with fat trimmed off. Low-sodium, lean deli meat. Dairy Low-fat (1%) or fat-free (skim) milk. Fat-free, low-fat, or reduced-fat cheeses. Nonfat, low-sodium ricotta or cottage cheese. Low-fat or nonfat yogurt. Low-fat, low-sodium cheese. Fats and oils Soft margarine without trans fats. Vegetable oil. Low-fat, reduced-fat, or light mayonnaise and salad dressings (reduced-sodium). Canola, safflower, olive, soybean, and sunflower oils. Avocado. Seasoning and other foods Herbs. Spices. Seasoning mixes without salt. Unsalted popcorn and pretzels. Fat-free sweets. What foods are not recommended? The items listed may not be a complete list. Talk with your dietitian about what dietary choices are best for you. Grains Baked goods made with fat, such as croissants, muffins, or some breads. Dry pasta or rice meal packs. Vegetables Creamed or fried vegetables. Vegetables in a cheese sauce. Regular canned vegetables (not low-sodium or reduced-sodium). Regular canned tomato sauce and paste (not low-sodium or reduced-sodium). Regular tomato and vegetable juice (not low-sodium or reduced-sodium). Pickles. Olives. Fruits Canned fruit in a light or heavy syrup. Fried fruit. Fruit in cream or butter sauce. Meat and other protein foods Fatty cuts of meat. Ribs. Fried meat. Bacon. Sausage. Bologna and other processed lunch meats. Salami. Fatback. Hotdogs. Bratwurst. Salted nuts and seeds. Canned beans with added salt. Canned or smoked fish. Whole eggs or egg yolks. Chicken or turkey with skin. Dairy Whole or 2% milk, cream, and half-and-half. Whole or full-fat cream cheese. Whole-fat or sweetened yogurt. Full-fat cheese. Nondairy creamers. Whipped toppings.  Processed cheese and cheese spreads. Fats and oils Butter. Stick margarine. Lard. Shortening. Ghee. Bacon fat. Tropical oils, such as coconut, palm kernel, or palm oil. Seasoning and other foods Salted popcorn and pretzels. Onion salt, garlic salt, seasoned salt, table salt, and sea salt. Worcestershire sauce. Tartar sauce. Barbecue sauce. Teriyaki sauce. Soy sauce, including reduced-sodium. Steak sauce. Canned and packaged gravies. Fish sauce. Oyster sauce. Cocktail sauce. Horseradish that you find on the shelf. Ketchup. Mustard. Meat flavorings and tenderizers. Bouillon cubes. Hot sauce and Tabasco sauce. Premade or packaged marinades. Premade or packaged taco seasonings. Relishes. Regular salad dressings. Where to find more information:  National Heart, Lung, and Blood Institute: www.nhlbi.nih.gov  American Heart Association: www.heart.org Summary  The DASH eating plan is a healthy eating plan that has been shown to reduce high blood pressure (hypertension). It may also reduce your risk for type 2 diabetes, heart disease, and stroke.  With the DASH eating plan, you should limit salt (sodium) intake to 2,300 mg a day. If you have hypertension, you may need to reduce your sodium intake to 1,500 mg a day.  When on the DASH eating plan, aim to eat more fresh fruits and vegetables, whole grains, lean proteins, low-fat dairy, and heart-healthy fats.  Work with your health care provider or diet and nutrition specialist (dietitian) to adjust your eating plan to your   individual calorie needs. This information is not intended to replace advice given to you by your health care provider. Make sure you discuss any questions you have with your health care provider. Document Revised: 04/22/2017 Document Reviewed: 05/03/2016 Elsevier Patient Education  2020 [employer]  

## 2019-07-27 ENCOUNTER — Other Ambulatory Visit: Payer: Self-pay

## 2019-07-27 ENCOUNTER — Other Ambulatory Visit: Payer: BC Managed Care – PPO

## 2019-07-27 DIAGNOSIS — Z125 Encounter for screening for malignant neoplasm of prostate: Secondary | ICD-10-CM

## 2019-07-27 DIAGNOSIS — E7800 Pure hypercholesterolemia, unspecified: Secondary | ICD-10-CM

## 2019-07-27 DIAGNOSIS — I10 Essential (primary) hypertension: Secondary | ICD-10-CM

## 2019-07-27 LAB — LIPID PANEL PICCOLO, WAIVED
Chol/HDL Ratio Piccolo,Waive: 2.8 mg/dL
Cholesterol Piccolo, Waived: 151 mg/dL (ref <200)
HDL Chol Piccolo, Waived: 55 mg/dL — ABNORMAL LOW (ref >59)
LDL Chol Calc Piccolo Waived: 75 mg/dL (ref <100)
Triglycerides Piccolo,Waived: 106 mg/dL (ref <150)
VLDL Chol Calc Piccolo,Waive: 21 mg/dL (ref <30)

## 2019-07-28 LAB — BASIC METABOLIC PANEL
BUN/Creatinine Ratio: 15 (ref 10–24)
BUN: 14 mg/dL (ref 8–27)
CO2: 22 mmol/L (ref 20–29)
Calcium: 9.4 mg/dL (ref 8.6–10.2)
Chloride: 102 mmol/L (ref 96–106)
Creatinine, Ser: 0.91 mg/dL (ref 0.76–1.27)
GFR calc Af Amer: 104 mL/min/{1.73_m2} (ref >59)
GFR calc non Af Amer: 90 mL/min/{1.73_m2} (ref >59)
Glucose: 99 mg/dL (ref 65–99)
Potassium: 4.7 mmol/L (ref 3.5–5.2)
Sodium: 140 mmol/L (ref 134–144)

## 2019-07-28 LAB — PSA: Prostate Specific Ag, Serum: 1.4 ng/mL (ref 0.0–4.0)

## 2019-07-29 NOTE — Progress Notes (Signed)
Good morning please let Colton Brown know via phone or letter: - Your labs have returned within normal ranges, including prostate blood work.  No changes needed at this time.  Have a great day!!

## 2019-07-31 ENCOUNTER — Encounter: Payer: Self-pay | Admitting: Nurse Practitioner

## 2019-07-31 NOTE — Progress Notes (Signed)
Lvm sent letter to make this apts.

## 2019-08-14 ENCOUNTER — Other Ambulatory Visit: Payer: Self-pay

## 2019-08-14 ENCOUNTER — Ambulatory Visit: Payer: BC Managed Care – PPO | Admitting: Nurse Practitioner

## 2019-08-14 ENCOUNTER — Other Ambulatory Visit: Payer: BC Managed Care – PPO

## 2019-11-13 ENCOUNTER — Encounter: Payer: Self-pay | Admitting: Nurse Practitioner

## 2019-12-27 ENCOUNTER — Encounter: Payer: BC Managed Care – PPO | Admitting: Nurse Practitioner

## 2020-01-03 ENCOUNTER — Encounter: Payer: BC Managed Care – PPO | Admitting: Nurse Practitioner

## 2020-01-10 ENCOUNTER — Ambulatory Visit (INDEPENDENT_AMBULATORY_CARE_PROVIDER_SITE_OTHER): Payer: BC Managed Care – PPO | Admitting: Nurse Practitioner

## 2020-01-10 ENCOUNTER — Encounter: Payer: Self-pay | Admitting: Nurse Practitioner

## 2020-01-10 ENCOUNTER — Other Ambulatory Visit: Payer: Self-pay

## 2020-01-10 VITALS — BP 111/69 | HR 70 | Temp 98.2°F | Ht 66.6 in | Wt 190.4 lb

## 2020-01-10 DIAGNOSIS — Z131 Encounter for screening for diabetes mellitus: Secondary | ICD-10-CM

## 2020-01-10 DIAGNOSIS — I10 Essential (primary) hypertension: Secondary | ICD-10-CM

## 2020-01-10 DIAGNOSIS — Z125 Encounter for screening for malignant neoplasm of prostate: Secondary | ICD-10-CM | POA: Diagnosis not present

## 2020-01-10 DIAGNOSIS — Z0000 Encounter for general adult medical examination without abnormal findings: Secondary | ICD-10-CM | POA: Diagnosis not present

## 2020-01-10 DIAGNOSIS — E7800 Pure hypercholesterolemia, unspecified: Secondary | ICD-10-CM

## 2020-01-10 DIAGNOSIS — Z1329 Encounter for screening for other suspected endocrine disorder: Secondary | ICD-10-CM

## 2020-01-10 LAB — MICROALBUMIN, URINE WAIVED
Creatinine, Urine Waived: 200 mg/dL (ref 10–300)
Microalb, Ur Waived: 150 mg/L — ABNORMAL HIGH (ref 0–19)

## 2020-01-10 MED ORDER — LISINOPRIL 2.5 MG PO TABS: 2.5000 mg | ORAL_TABLET | Freq: Every day | ORAL | 4 refills | Status: DC

## 2020-01-10 MED ORDER — ATORVASTATIN CALCIUM 20 MG PO TABS: 20.0000 mg | ORAL_TABLET | Freq: Every day | ORAL | 4 refills | Status: DC

## 2020-01-10 NOTE — Progress Notes (Signed)
BP 111/69    Pulse 70    Temp 98.2 F (36.8 C) (Oral)    Ht 5' 6.6" (1.692 m)    Wt 190 lb 6.4 oz (86.4 kg)    SpO2 98%    BMI 30.18 kg/m    Subjective:    Patient ID: Colton Brown, male    DOB: Jun 15, 1955, 64 y.o.   MRN: [id number]  HPI: Colton Brown is a 64 y.o. male presenting on 01/10/2020 for comprehensive medical examination. Current medical complaints include:none  He currently lives with: none Interim Problems from his last visit: no  Functional Status Survey: Is the patient deaf or have difficulty hearing?: No Does the patient have difficulty seeing, even when wearing glasses/contacts?: No Does the patient have difficulty concentrating, remembering, or making decisions?: No Does the patient have difficulty walking or climbing stairs?: No Does the patient have difficulty dressing or bathing?: No Does the patient have difficulty doing errands alone such as visiting a doctor's office or shopping?: No  FALL RISK: Fall Risk  01/10/2020 10/06/2018 09/16/2017 09/14/2016  Falls in the past year? 0 0 No No  Number falls in past yr: 0 0 - -  Injury with Fall? 0 0 - -  Risk for fall due to : No Fall Risks - - -  Follow up Falls evaluation completed - - -    Depression Screen Depression screen [facility] 2/9 01/10/2020 10/06/2018 09/16/2017 09/14/2016  Decreased Interest 0 0 0 0  Down, Depressed, Hopeless 0 0 0 0  PHQ - 2 Score 0 0 0 0  Altered sleeping - 3 - -  Tired, decreased energy - 0 - -  Change in appetite - 0 - -  Feeling bad or failure about yourself  - 0 - -  Trouble concentrating - 0 - -  Moving slowly or fidgety/restless - 0 - -  Suicidal thoughts - 0 - -  PHQ-9 Score - 3 - -    Advanced Directives <no information>  Past Medical History:  Past Medical History:  Diagnosis Date   Chronic kidney disease    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   GERD (gastroesophageal reflux disease)    DUE TO GALLBLADDER-NO MEDS   Hyperlipidemia    Lumbago    Psoriasis     Surgical History:   Past Surgical History:  Procedure Laterality Date   ANKLE SURGERY Left [place] N/A 11/11/2015   Procedure: LAPAROSCOPIC CHOLECYSTECTOMY WITH INTRAOPERATIVE CHOLANGIOGRAM;  Surgeon: Hubbard Robinson, MD;  Location: [facility] ORS;  Service: General;  Laterality: N/A;   COLONOSCOPY WITH PROPOFOL N/A 11/02/2016   Procedure: COLONOSCOPY WITH PROPOFOL;  Surgeon: Lucilla Lame, MD;  Location: [facility] ENDOSCOPY;  Service: Endoscopy;  Laterality: N/A;   LUMBAR DISC SURGERY     L4-L5   WISDOM TOOTH EXTRACTION      Medications:  Current Outpatient Medications on File Prior to Visit  Medication Sig   aspirin 81 MG tablet Take 81 mg by mouth daily.   Turmeric 500 MG CAPS Take 1 capsule by mouth daily.   No current facility-administered medications on file prior to visit.    Allergies:  No Known Allergies  Social History:  Social History   Socioeconomic History   Marital status: Divorced    Spouse name: Not on file   Number of children: Not on file   Years of education: Not on file   Highest education level: Not on file  Occupational History  Not on file  Tobacco Use   Smoking status: Never Smoker   Smokeless tobacco: Never Used  Vaping Use   Vaping Use: Never used  Substance and Sexual Activity   Alcohol use: No    Alcohol/week: 0.0 standard drinks   Drug use: No   Sexual activity: Yes  Other Topics Concern   Not on file  Social History Narrative   Not on file   Social Determinants of Health   Financial Resource Strain:    Difficulty of Paying Living Expenses: Not on file  Food Insecurity:    Worried About [place] in the Last Year: Not on file   Ran Out of Food in the Last Year: Not on file  Transportation Needs:    Lack of Transportation (Medical): Not on file   Lack of Transportation (Non-Medical): Not on file  Physical Activity:    Days of Exercise per Week: Not on file   Minutes of Exercise per  Session: Not on file  Stress:    Feeling of Stress : Not on file  Social Connections:    Frequency of Communication with Friends and Family: Not on file   Frequency of Social Gatherings with Friends and Family: Not on file   Attends Religious Services: Not on file   Active Member of [employer] or Organizations: Not on file   Attends Archivist Meetings: Not on file   Marital Status: Not on file  Intimate Partner Violence:    Fear of Current or Ex-Partner: Not on file   Emotionally Abused: Not on file   Physically Abused: Not on file   Sexually Abused: Not on file   Social History   Tobacco Use  Smoking Status Never Smoker  Smokeless Tobacco Never Used   Social History   Substance and Sexual Activity  Alcohol Use No   Alcohol/week: 0.0 standard drinks    Family History:  Family History  Problem Relation Age of Onset   Asthma Mother    Stroke Mother    Migraines Mother    Thyroid disease Mother    Diabetes Mother    Heart disease Father    Hyperlipidemia Father    Hypertension Father    Stroke Father    Hyperlipidemia Brother    Hypertension Brother    Diabetes Maternal Grandmother    Diabetes Maternal Grandfather    Heart disease Paternal Grandmother     Past medical history, surgical history, medications, allergies, family history and social history reviewed with patient today and changes made to appropriate areas of the chart.   Review of Systems - negative All other ROS negative except what is listed above and in the HPI.      Objective:    BP 111/69    Pulse 70    Temp 98.2 F (36.8 C) (Oral)    Ht 5' 6.6" (1.692 m)    Wt 190 lb 6.4 oz (86.4 kg)    SpO2 98%    BMI 30.18 kg/m   Wt Readings from Last 3 Encounters:  01/10/20 190 lb 6.4 oz (86.4 kg)  07/24/19 190 lb 9.6 oz (86.5 kg)  01/09/19 189 lb 6.4 oz (85.9 kg)    Physical Exam Vitals and nursing note reviewed.  Constitutional:      General: He is awake. He is not  in acute distress.    Appearance: He is well-developed and well-groomed. He is not ill-appearing.  HENT:     Head: Normocephalic and atraumatic.  Right Ear: Hearing, tympanic membrane, ear canal and external ear normal. No drainage.     Left Ear: Hearing, tympanic membrane, ear canal and external ear normal. No drainage.     Nose: Nose normal.     Mouth/Throat:     Pharynx: Uvula midline.  Eyes:     General: Lids are normal.        Right eye: No discharge.        Left eye: No discharge.     Extraocular Movements: Extraocular movements intact.     Conjunctiva/sclera: Conjunctivae normal.     Pupils: Pupils are equal, round, and reactive to light.     Visual Fields: Right eye visual fields normal and left eye visual fields normal.  Neck:     Thyroid: No thyromegaly.     Vascular: No carotid bruit or JVD.     Trachea: Trachea normal.  Cardiovascular:     Rate and Rhythm: Normal rate and regular rhythm.     Heart sounds: Normal heart sounds, S1 normal and S2 normal. No murmur heard.  No gallop.   Pulmonary:     Effort: Pulmonary effort is normal. No accessory muscle usage or respiratory distress.     Breath sounds: Normal breath sounds.  Abdominal:     General: Bowel sounds are normal.     Palpations: Abdomen is soft. There is no hepatomegaly or splenomegaly.     Tenderness: There is no abdominal tenderness.  Musculoskeletal:        General: Normal range of motion.     Cervical back: Normal range of motion and neck supple.     Right lower leg: No edema.     Left lower leg: No edema.  Lymphadenopathy:     Head:     Right side of head: No submental, submandibular, tonsillar, preauricular or posterior auricular adenopathy.     Left side of head: No submental, submandibular, tonsillar, preauricular or posterior auricular adenopathy.     Cervical: No cervical adenopathy.  Skin:    General: Skin is warm and dry.     Capillary Refill: Capillary refill takes less than 2 seconds.      Findings: No rash.  Neurological:     Mental Status: He is alert and oriented to person, place, and time.     Cranial Nerves: Cranial nerves are intact.     Gait: Gait is intact.     Deep Tendon Reflexes: Reflexes are normal and symmetric.     Reflex Scores:      Brachioradialis reflexes are 2+ on the right side and 2+ on the left side.      Patellar reflexes are 2+ on the right side and 2+ on the left side. Psychiatric:        Attention and Perception: Attention normal.        Mood and Affect: Mood normal.        Speech: Speech normal.        Behavior: Behavior normal. Behavior is cooperative.        Thought Content: Thought content normal.        Cognition and Memory: Cognition normal.        Judgment: Judgment normal.    Results for orders placed or performed in visit on 07/27/19  PSA  Result Value Ref Range   Prostate Specific Ag, Serum 1.4 0.0 - 4.0 ng/mL  Basic Metabolic Panel (BMET)  Result Value Ref Range   Glucose 99 65 - 99 mg/dL  BUN 14 8 - 27 mg/dL   Creatinine, Ser 0.91 0.76 - 1.27 mg/dL   GFR calc non Af Amer 90 >59 mL/min/1.73   GFR calc Af Amer 104 >59 mL/min/1.73   BUN/Creatinine Ratio 15 10 - 24   Sodium 140 134 - 144 mmol/L   Potassium 4.7 3.5 - 5.2 mmol/L   Chloride 102 96 - 106 mmol/L   CO2 22 20 - 29 mmol/L   Calcium 9.4 8.6 - 10.2 mg/dL  Lipid Panel Piccolo, Waived  Result Value Ref Range   Cholesterol Piccolo, Waived 151 <200 mg/dL   HDL Chol Piccolo, Waived 55 (L) >59 mg/dL   Triglycerides Piccolo,Waived 106 <150 mg/dL   Chol/HDL Ratio Piccolo,Waive 2.8 mg/dL   LDL Chol Calc Piccolo Waived 75 <100 mg/dL   VLDL Chol Calc Piccolo,Waive 21 <30 mg/dL      Assessment & Plan:   Problem List Items Addressed This Visit      Cardiovascular and Mediastinum   Essential hypertension, benign   Relevant Medications   lisinopril (ZESTRIL) 2.5 MG tablet   atorvastatin (LIPITOR) 20 MG tablet     Other   Hyperlipidemia   Relevant Medications    lisinopril (ZESTRIL) 2.5 MG tablet   atorvastatin (LIPITOR) 20 MG tablet   Screening PSA (prostate specific antigen)   Relevant Orders   PSA    Other Visit Diagnoses    Routine general medical examination at a health care facility    -  Primary   Annual labs obtained and refills sent in.   Relevant Orders   CBC with Differential/Platelet   Comprehensive metabolic panel   Lipid Panel w/o Chol/HDL Ratio   PSA   TSH   HgB A1c   Microalbumin, Urine Waived   Thyroid disorder screen       TSH on labs today   Relevant Orders   TSH   Screening for diabetes mellitus       A1C on labs today   Relevant Orders   HgB A1c     Discussed aspirin prophylaxis for myocardial infarction prevention and decision was made to continue ASA  LABORATORY TESTING:  Health maintenance labs ordered today as discussed above.   The natural history of prostate cancer and ongoing controversy regarding screening and potential treatment outcomes of prostate cancer has been discussed with the patient. The meaning of a false positive PSA and a false negative PSA has been discussed. He indicates understanding of the limitations of this screening test and wishes to proceed with screening PSA testing.   IMMUNIZATIONS:   - Tdap: Tetanus vaccination status reviewed: last tetanus booster within 10 years. - Influenza: Up to date - Pneumovax: Not applicable - Prevnar: Not applicable - Zostavax vaccine: Up to date  SCREENING: - Colonoscopy: Up to date  Discussed with patient purpose of the colonoscopy is to detect colon cancer at curable precancerous or early stages   - AAA Screening: Not applicable  -Hearing Test: Not applicable  -Spirometry: Not applicable   PATIENT COUNSELING:    Sexuality: Discussed sexually transmitted diseases, partner selection, use of condoms, avoidance of unintended pregnancy  and contraceptive alternatives.   Advised to avoid cigarette smoking.  I discussed with the patient that  most people either abstain from alcohol or drink within safe limits (<=14/week and <=4 drinks/occasion for males, <=7/weeks and <= 3 drinks/occasion for females) and that the risk for alcohol disorders and other health effects rises proportionally with the number of drinks per week and how often  a drinker exceeds daily limits.  Discussed cessation/primary prevention of drug use and availability of treatment for abuse.   Diet: Encouraged to adjust caloric intake to maintain  or achieve ideal body weight, to reduce intake of dietary saturated fat and total fat, to limit sodium intake by avoiding high sodium foods and not adding table salt, and to maintain adequate dietary potassium and calcium preferably from fresh fruits, vegetables, and low-fat dairy products.    stressed the importance of regular exercise  Injury prevention: Discussed safety belts, safety helmets, smoke detector, smoking near bedding or upholstery.   Dental health: Discussed importance of regular tooth brushing, flossing, and dental visits.   Follow up plan: NEXT PREVENTATIVE PHYSICAL DUE IN 1 YEAR. No follow-ups on file.

## 2020-01-10 NOTE — Patient Instructions (Signed)
Healthy Eating Following a healthy eating pattern may help you to achieve and maintain a healthy body weight, reduce the risk of chronic disease, and live a long and productive life. It is important to follow a healthy eating pattern at an appropriate calorie level for your body. Your nutritional needs should be met primarily through food by choosing a variety of nutrient-rich foods. What are tips for following this plan? Reading food labels  Read labels and choose the following: ? Reduced or low sodium. ? Juices with 100% fruit juice. ? Foods with low saturated fats and high polyunsaturated and monounsaturated fats. ? Foods with whole grains, such as whole wheat, cracked wheat, brown rice, and wild rice. ? Whole grains that are fortified with folic acid. This is recommended for women who are pregnant or who want to become pregnant.  Read labels and avoid the following: ? Foods with a lot of added sugars. These include foods that contain brown sugar, corn sweetener, corn syrup, dextrose, fructose, glucose, high-fructose corn syrup, honey, invert sugar, lactose, malt syrup, maltose, molasses, raw sugar, sucrose, trehalose, or turbinado sugar.  Do not eat more than the following amounts of added sugar per day:  6 teaspoons (25 g) for women.  9 teaspoons (38 g) for men. ? Foods that contain processed or refined starches and grains. ? Refined grain products, such as white flour, degermed cornmeal, white bread, and white rice. Shopping  Choose nutrient-rich snacks, such as vegetables, whole fruits, and nuts. Avoid high-calorie and high-sugar snacks, such as potato chips, fruit snacks, and candy.  Use oil-based dressings and spreads on foods instead of solid fats such as butter, stick margarine, or cream cheese.  Limit pre-made sauces, mixes, and "instant" products such as flavored rice, instant noodles, and ready-made pasta.  Try more plant-protein sources, such as tofu, tempeh, black beans,  edamame, lentils, nuts, and seeds.  Explore eating plans such as the Mediterranean diet or vegetarian diet. Cooking  Use oil to saut or stir-fry foods instead of solid fats such as butter, stick margarine, or lard.  Try baking, boiling, grilling, or broiling instead of frying.  Remove the fatty part of meats before cooking.  Steam vegetables in water or broth. Meal planning   At meals, imagine dividing your plate into fourths: ? One-half of your plate is fruits and vegetables. ? One-fourth of your plate is whole grains. ? One-fourth of your plate is protein, especially lean meats, poultry, eggs, tofu, beans, or nuts.  Include low-fat dairy as part of your daily diet. Lifestyle  Choose healthy options in all settings, including home, work, school, restaurants, or stores.  Prepare your food safely: ? Wash your hands after handling raw meats. ? Keep food preparation surfaces clean by regularly washing with hot, soapy water. ? Keep raw meats separate from ready-to-eat foods, such as fruits and vegetables. ? Cook seafood, meat, poultry, and eggs to the recommended internal temperature. ? Store foods at safe temperatures. In general:  Keep cold foods at 59F (4.4C) or below.  Keep hot foods at 159F (60C) or above.  Keep your freezer at [facility] (-17.8C) or below.  Foods are no longer safe to eat when they have been between the temperatures of 40-159F (4.4-60C) for more than 2 hours. What foods should I eat? Fruits Aim to eat 2 cup-equivalents of fresh, canned (in natural juice), or frozen fruits each day. Examples of 1 cup-equivalent of fruit include 1 small apple, 8 large strawberries, 1 cup canned fruit,  cup  dried fruit, or 1 cup 100% juice. Vegetables Aim to eat 2-3 cup-equivalents of fresh and frozen vegetables each day, including different varieties and colors. Examples of 1 cup-equivalent of vegetables include 2 medium carrots, 2 cups raw, leafy greens, 1 cup chopped  vegetable (raw or cooked), or 1 medium baked potato. Grains Aim to eat 6 ounce-equivalents of whole grains each day. Examples of 1 ounce-equivalent of grains include 1 slice of bread, 1 cup ready-to-eat cereal, 3 cups popcorn, or  cup cooked rice, pasta, or cereal. Meats and other proteins Aim to eat 5-6 ounce-equivalents of protein each day. Examples of 1 ounce-equivalent of protein include 1 egg, 1/2 cup nuts or seeds, or 1 tablespoon (16 g) peanut butter. A cut of meat or fish that is the size of a deck of cards is about 3-4 ounce-equivalents.  Of the protein you eat each week, try to have at least 8 ounces come from seafood. This includes salmon, trout, herring, and anchovies. Dairy Aim to eat 3 cup-equivalents of fat-free or low-fat dairy each day. Examples of 1 cup-equivalent of dairy include 1 cup (240 mL) milk, 8 ounces (250 g) yogurt, 1 ounces (44 g) natural cheese, or 1 cup (240 mL) fortified soy milk. Fats and oils  Aim for about 5 teaspoons (21 g) per day. Choose monounsaturated fats, such as canola and olive oils, avocados, peanut butter, and most nuts, or polyunsaturated fats, such as sunflower, corn, and soybean oils, walnuts, pine nuts, sesame seeds, sunflower seeds, and flaxseed. Beverages  Aim for six 8-oz glasses of water per day. Limit coffee to three to five 8-oz cups per day.  Limit caffeinated beverages that have added calories, such as soda and energy drinks.  Limit alcohol intake to no more than 1 drink a day for nonpregnant women and 2 drinks a day for men. One drink equals 12 oz of beer (355 mL), 5 oz of wine (148 mL), or 1 oz of hard liquor (44 mL). Seasoning and other foods  Avoid adding excess amounts of salt to your foods. Try flavoring foods with herbs and spices instead of salt.  Avoid adding sugar to foods.  Try using oil-based dressings, sauces, and spreads instead of solid fats. This information is based on general [place] nutrition guidelines. For more  information, visit [employer]. Exact amounts may vary based on your nutrition needs. Summary  A healthy eating plan may help you to maintain a healthy weight, reduce the risk of chronic diseases, and stay active throughout your life.  Plan your meals. Make sure you eat the right portions of a variety of nutrient-rich foods.  Try baking, boiling, grilling, or broiling instead of frying.  Choose healthy options in all settings, including home, work, school, restaurants, or stores. This information is not intended to replace advice given to you by your health care provider. Make sure you discuss any questions you have with your health care provider. Document Revised: 08/22/2017 Document Reviewed: 08/22/2017 Elsevier Patient Education  [place].

## 2020-01-11 LAB — COMPREHENSIVE METABOLIC PANEL
ALT: 46 IU/L — ABNORMAL HIGH (ref 0–44)
AST: 22 IU/L (ref 0–40)
Albumin/Globulin Ratio: 2 (ref 1.2–2.2)
Albumin: 4.7 g/dL (ref 3.8–4.8)
Alkaline Phosphatase: 68 IU/L (ref 48–121)
BUN/Creatinine Ratio: 9 — ABNORMAL LOW (ref 10–24)
BUN: 9 mg/dL (ref 8–27)
Bilirubin Total: 1 mg/dL (ref 0.0–1.2)
CO2: 25 mmol/L (ref 20–29)
Calcium: 9.6 mg/dL (ref 8.6–10.2)
Chloride: 100 mmol/L (ref 96–106)
Creatinine, Ser: 0.99 mg/dL (ref 0.76–1.27)
GFR calc Af Amer: 94 mL/min/{1.73_m2} (ref >59)
GFR calc non Af Amer: 81 mL/min/{1.73_m2} (ref >59)
Globulin, Total: 2.4 g/dL (ref 1.5–4.5)
Glucose: 104 mg/dL — ABNORMAL HIGH (ref 65–99)
Potassium: 4.5 mmol/L (ref 3.5–5.2)
Sodium: 138 mmol/L (ref 134–144)
Total Protein: 7.1 g/dL (ref 6.0–8.5)

## 2020-01-11 LAB — LIPID PANEL W/O CHOL/HDL RATIO
Cholesterol, Total: 164 mg/dL (ref 100–199)
HDL: 47 mg/dL (ref >39)
LDL Chol Calc (NIH): 91 mg/dL (ref 0–99)
Triglycerides: 147 mg/dL (ref 0–149)
VLDL Cholesterol Cal: 26 mg/dL (ref 5–40)

## 2020-01-11 LAB — CBC WITH DIFFERENTIAL/PLATELET
Basophils Absolute: 0.1 10*3/uL (ref 0.0–0.2)
Basos: 1 %
EOS (ABSOLUTE): 0.2 10*3/uL (ref 0.0–0.4)
Eos: 3 %
Hematocrit: 50.6 % (ref 37.5–51.0)
Hemoglobin: 17.4 g/dL (ref 13.0–17.7)
Immature Grans (Abs): 0 10*3/uL (ref 0.0–0.1)
Immature Granulocytes: 0 %
Lymphocytes Absolute: 1.6 10*3/uL (ref 0.7–3.1)
Lymphs: 23 %
MCH: 30.4 pg (ref 26.6–33.0)
MCHC: 34.4 g/dL (ref 31.5–35.7)
MCV: 88 fL (ref 79–97)
Monocytes Absolute: 0.6 10*3/uL (ref 0.1–0.9)
Monocytes: 8 %
Neutrophils Absolute: 4.7 10*3/uL (ref 1.4–7.0)
Neutrophils: 65 %
Platelets: 171 10*3/uL (ref 150–450)
RBC: 5.73 x10E6/uL (ref 4.14–5.80)
RDW: 13.3 % (ref 11.6–15.4)
WBC: 7.1 10*3/uL (ref 3.4–10.8)

## 2020-01-11 LAB — TSH: TSH: 2.14 u[IU]/mL (ref 0.450–4.500)

## 2020-01-11 LAB — HEMOGLOBIN A1C
Est. average glucose Bld gHb Est-mCnc: 120 mg/dL
Hgb A1c MFr Bld: 5.8 % — ABNORMAL HIGH (ref 4.8–5.6)

## 2020-01-11 LAB — PSA: Prostate Specific Ag, Serum: 1.7 ng/mL (ref 0.0–4.0)

## 2020-01-11 NOTE — Progress Notes (Signed)
Good morning, please let Colton Brown know his labs have returned.  Thyroid, prostate, kidney, and liver function look great.  Cholesterol levels look good, however in future we may want to increase Atorvastatin to 40 MG to get LDL <70 for stroke prevention, currently it is 91.  A1C, diabetes testing, did increase slightly to 5.8%.  Still prediabetes, goal is to avoid hitting 6.5% or greater.  Your level had been 5.7% last check. Continue diet focus and regular activity.  If any questions let me know.  Have a great day!!

## 2020-02-27 ENCOUNTER — Other Ambulatory Visit: Payer: Self-pay

## 2020-02-27 ENCOUNTER — Ambulatory Visit (INDEPENDENT_AMBULATORY_CARE_PROVIDER_SITE_OTHER): Payer: BC Managed Care – PPO | Admitting: Nurse Practitioner

## 2020-02-27 DIAGNOSIS — Z23 Encounter for immunization: Secondary | ICD-10-CM | POA: Diagnosis not present

## 2020-07-15 ENCOUNTER — Encounter: Payer: Self-pay | Admitting: Nurse Practitioner

## 2020-07-15 ENCOUNTER — Ambulatory Visit: Payer: BC Managed Care – PPO | Admitting: Nurse Practitioner

## 2020-07-15 ENCOUNTER — Other Ambulatory Visit: Payer: Self-pay

## 2020-07-15 VITALS — BP 128/68 | HR 84 | Temp 97.9°F | Wt 193.4 lb

## 2020-07-15 DIAGNOSIS — E7800 Pure hypercholesterolemia, unspecified: Secondary | ICD-10-CM | POA: Diagnosis not present

## 2020-07-15 DIAGNOSIS — I10 Essential (primary) hypertension: Secondary | ICD-10-CM

## 2020-07-15 DIAGNOSIS — R7301 Impaired fasting glucose: Secondary | ICD-10-CM

## 2020-07-15 LAB — BAYER DCA HB A1C WAIVED: HB A1C (BAYER DCA - WAIVED): 5.7 % (ref <7.0)

## 2020-07-15 LAB — MICROALBUMIN, URINE WAIVED
Creatinine, Urine Waived: 200 mg/dL (ref 10–300)
Microalb, Ur Waived: 80 mg/L — ABNORMAL HIGH (ref 0–19)

## 2020-07-15 NOTE — Assessment & Plan Note (Signed)
A1C today 5.7% today, remaining stable with no increase.  Continue to monitor and focus on diet.

## 2020-07-15 NOTE — Assessment & Plan Note (Signed)
Chronic, ongoing.  Continue current medication regimen and adjust as needed.  Continue diet focus.  Obtain lipid panel.

## 2020-07-15 NOTE — Assessment & Plan Note (Signed)
Chronic, stable with BP below goal on recheck today and at goal at home.  Continue current medication regimen and adjust as needed.  Recommend checking BP at home at least a few mornings a week and documenting.  DASH diet focus.  CMP today.  Return in 6 months for physical.

## 2020-07-15 NOTE — Progress Notes (Signed)
BP 128/68 (BP Location: Left Arm)   Pulse 84   Temp 97.9 F (36.6 C) (Oral)   Wt 193 lb 6.4 oz (87.7 kg)   SpO2 98%   BMI 30.66 kg/m    Subjective:    Patient ID: Colton Brown, male    DOB: 1955-10-21, 65 y.o.   MRN: [id number]  HPI: Colton Brown is a 65 y.o. male  Chief Complaint  Patient presents with  . Follow-up    Pt states he has no problems nor concerns at this moment.    HYPERTENSION / HYPERLIPIDEMIA Continues on Lisinopril and Atorvastatin + ASA. Recent labs with normal kidney function and mild elevation in ALT at 46 -- LDL 91 and TCHOL 164. Satisfied with current treatment?yes Duration of hypertension:chronic BP monitoring frequency:occasional BP range: 110/70 -- often in this range BP medication side effects:no Duration of hyperlipidemia:chronic Cholesterol medication side effects:no Cholesterol supplements: none Medication compliance:good compliance Aspirin:yes Recent stressors:no Recurrent headaches:no Visual changes:no Palpitations:no Dyspnea:no Chest pain:no Lower extremity edema:no Dizzy/lightheaded:no  IFG: A1c range 5.7 to 6%, recent in August 5.8%. Polydipsia/polyuria: no Visual disturbance: no Chest pain: no Paresthesias: no  Relevant past medical, surgical, family and social history reviewed and updated as indicated. Interim medical history since our last visit reviewed. Allergies and medications reviewed and updated.  Review of Systems  Constitutional: Negative for activity change, diaphoresis, fatigue and fever.  Respiratory: Negative for cough, chest tightness, shortness of breath and wheezing.   Cardiovascular: Negative for chest pain, palpitations and leg swelling.  Gastrointestinal: Negative.   Endocrine: Negative for polydipsia, polyphagia and polyuria.  Neurological: Negative.   Psychiatric/Behavioral: Negative.     Per HPI unless specifically indicated above     Objective:    BP 128/68 (BP Location: Left  Arm)   Pulse 84   Temp 97.9 F (36.6 C) (Oral)   Wt 193 lb 6.4 oz (87.7 kg)   SpO2 98%   BMI 30.66 kg/m   Wt Readings from Last 3 Encounters:  07/15/20 193 lb 6.4 oz (87.7 kg)  01/10/20 190 lb 6.4 oz (86.4 kg)  07/24/19 190 lb 9.6 oz (86.5 kg)    Physical Exam Vitals and nursing note reviewed.  Constitutional:      General: He is awake. He is not in acute distress.    Appearance: He is well-developed and well-groomed. He is obese. He is not ill-appearing.  HENT:     Head: Normocephalic and atraumatic.     Right Ear: Hearing normal. No drainage.     Left Ear: Hearing normal. No drainage.  Eyes:     General: Lids are normal.        Right eye: No discharge.        Left eye: No discharge.     Conjunctiva/sclera: Conjunctivae normal.     Pupils: Pupils are equal, round, and reactive to light.  Neck:     Thyroid: No thyromegaly.     Vascular: No carotid bruit.     Trachea: Trachea normal.  Cardiovascular:     Rate and Rhythm: Normal rate and regular rhythm.     Heart sounds: Normal heart sounds, S1 normal and S2 normal. No murmur heard. No gallop.   Pulmonary:     Effort: Pulmonary effort is normal. No accessory muscle usage or respiratory distress.     Breath sounds: Normal breath sounds.  Abdominal:     General: Bowel sounds are normal.     Palpations: Abdomen is soft. There is no hepatomegaly  or splenomegaly.  Musculoskeletal:        General: Normal range of motion.     Cervical back: Normal range of motion and neck supple.     Right lower leg: No edema.     Left lower leg: No edema.  Skin:    General: Skin is warm and dry.     Capillary Refill: Capillary refill takes less than 2 seconds.     Findings: No rash.  Neurological:     Mental Status: He is alert and oriented to person, place, and time.     Deep Tendon Reflexes: Reflexes are normal and symmetric.  Psychiatric:        Attention and Perception: Attention normal.        Mood and Affect: Mood normal.         Speech: Speech normal.        Behavior: Behavior normal. Behavior is cooperative.        Thought Content: Thought content normal.     Results for orders placed or performed in visit on 01/10/20  CBC with Differential/Platelet  Result Value Ref Range   WBC 7.1 3.4 - 10.8 x10E3/uL   RBC 5.73 4.14 - 5.80 x10E6/uL   Hemoglobin 17.4 13.0 - 17.7 g/dL   Hematocrit 50.6 37.5 - 51.0 %   MCV 88 79 - 97 fL   MCH 30.4 26.6 - 33.0 pg   MCHC 34.4 31.5 - 35.7 g/dL   RDW 13.3 11.6 - 15.4 %   Platelets 171 150 - 450 x10E3/uL   Neutrophils 65 Not Estab. %   Lymphs 23 Not Estab. %   Monocytes 8 Not Estab. %   Eos 3 Not Estab. %   Basos 1 Not Estab. %   Neutrophils Absolute 4.7 1.4 - 7.0 x10E3/uL   Lymphocytes Absolute 1.6 0.7 - 3.1 x10E3/uL   Monocytes Absolute 0.6 0.1 - 0.9 x10E3/uL   EOS (ABSOLUTE) 0.2 0.0 - 0.4 x10E3/uL   Basophils Absolute 0.1 0.0 - 0.2 x10E3/uL   Immature Granulocytes 0 Not Estab. %   Immature Grans (Abs) 0.0 0.0 - 0.1 x10E3/uL  Comprehensive metabolic panel  Result Value Ref Range   Glucose 104 (H) 65 - 99 mg/dL   BUN 9 8 - 27 mg/dL   Creatinine, Ser 0.99 0.76 - 1.27 mg/dL   GFR calc non Af Amer 81 >59 mL/min/1.73   GFR calc Af Amer 94 >59 mL/min/1.73   BUN/Creatinine Ratio 9 (L) 10 - 24   Sodium 138 134 - 144 mmol/L   Potassium 4.5 3.5 - 5.2 mmol/L   Chloride 100 96 - 106 mmol/L   CO2 25 20 - 29 mmol/L   Calcium 9.6 8.6 - 10.2 mg/dL   Total Protein 7.1 6.0 - 8.5 g/dL   Albumin 4.7 3.8 - 4.8 g/dL   Globulin, Total 2.4 1.5 - 4.5 g/dL   Albumin/Globulin Ratio 2.0 1.2 - 2.2   Bilirubin Total 1.0 0.0 - 1.2 mg/dL   Alkaline Phosphatase 68 48 - 121 IU/L   AST 22 0 - 40 IU/L   ALT 46 (H) 0 - 44 IU/L  Lipid Panel w/o Chol/HDL Ratio  Result Value Ref Range   Cholesterol, Total 164 100 - 199 mg/dL   Triglycerides 147 0 - 149 mg/dL   HDL 47 >39 mg/dL   VLDL Cholesterol Cal 26 5 - 40 mg/dL   LDL Chol Calc (NIH) 91 0 - 99 mg/dL  PSA  Result Value Ref Range  Prostate Specific Ag, Serum 1.7 0.0 - 4.0 ng/mL  TSH  Result Value Ref Range   TSH 2.140 0.450 - 4.500 uIU/mL  HgB A1c  Result Value Ref Range   Hgb A1c MFr Bld 5.8 (H) 4.8 - 5.6 %   Est. average glucose Bld gHb Est-mCnc 120 mg/dL  Microalbumin, Urine Waived  Result Value Ref Range   Microalb, Ur Waived 150 (H) 0 - 19 mg/L   Creatinine, Urine Waived 200 10 - 300 mg/dL   Microalb/Creat Ratio 30-300 (H) <30 mg/g      Assessment & Plan:   Problem List Items Addressed This Visit      Cardiovascular and Mediastinum   Essential hypertension, benign - Primary    Chronic, stable with BP below goal on recheck today and at goal at home.  Continue current medication regimen and adjust as needed.  Recommend checking BP at home at least a few mornings a week and documenting.  DASH diet focus.  CMP today.  Return in 6 months for physical.      Relevant Orders   Comprehensive metabolic panel   Microalbumin, Urine Waived     Endocrine   Impaired fasting blood sugar    A1C today 5.7% today, remaining stable with no increase.  Continue to monitor and focus on diet.      Relevant Orders   Bayer DCA Hb A1c Waived     Other   Hyperlipidemia    Chronic, ongoing.  Continue current medication regimen and adjust as needed.  Continue diet focus.  Obtain lipid panel.      Relevant Orders   Lipid Panel w/o Chol/HDL Ratio       Follow up plan: Return in about 6 months (around 01/12/2021) for Annual physical.

## 2020-07-15 NOTE — Patient Instructions (Signed)

## 2020-07-16 LAB — LIPID PANEL W/O CHOL/HDL RATIO
Cholesterol, Total: 166 mg/dL (ref 100–199)
HDL: 45 mg/dL (ref >39)
LDL Chol Calc (NIH): 97 mg/dL (ref 0–99)
Triglycerides: 137 mg/dL (ref 0–149)
VLDL Cholesterol Cal: 24 mg/dL (ref 5–40)

## 2020-07-16 LAB — COMPREHENSIVE METABOLIC PANEL
ALT: 43 IU/L (ref 0–44)
AST: 26 IU/L (ref 0–40)
Albumin/Globulin Ratio: 2 (ref 1.2–2.2)
Albumin: 4.7 g/dL (ref 3.8–4.8)
Alkaline Phosphatase: 71 IU/L (ref 44–121)
BUN/Creatinine Ratio: 11 (ref 10–24)
BUN: 11 mg/dL (ref 8–27)
Bilirubin Total: 1.1 mg/dL (ref 0.0–1.2)
CO2: 21 mmol/L (ref 20–29)
Calcium: 9.3 mg/dL (ref 8.6–10.2)
Chloride: 100 mmol/L (ref 96–106)
Creatinine, Ser: 1.02 mg/dL (ref 0.76–1.27)
GFR calc Af Amer: 90 mL/min/{1.73_m2} (ref >59)
GFR calc non Af Amer: 78 mL/min/{1.73_m2} (ref >59)
Globulin, Total: 2.3 g/dL (ref 1.5–4.5)
Glucose: 108 mg/dL — ABNORMAL HIGH (ref 65–99)
Potassium: 4.5 mmol/L (ref 3.5–5.2)
Sodium: 139 mmol/L (ref 134–144)
Total Protein: 7 g/dL (ref 6.0–8.5)

## 2020-07-16 NOTE — Progress Notes (Signed)
Please let Colton Brown know that overall labs continue to be stable we do not need to make any changes at this time.  Have a great day!! Keep being awesome!!  Thank you for allowing me to participate in your care. Kindest regards, Colton Brown

## 2021-01-21 ENCOUNTER — Other Ambulatory Visit: Payer: Self-pay

## 2021-01-21 ENCOUNTER — Encounter: Payer: Self-pay | Admitting: Nurse Practitioner

## 2021-01-21 ENCOUNTER — Ambulatory Visit (INDEPENDENT_AMBULATORY_CARE_PROVIDER_SITE_OTHER): Payer: BC Managed Care – PPO | Admitting: Nurse Practitioner

## 2021-01-21 VITALS — BP 123/75 | HR 83 | Temp 99.0°F | Ht 66.0 in | Wt 192.2 lb

## 2021-01-21 DIAGNOSIS — N400 Enlarged prostate without lower urinary tract symptoms: Secondary | ICD-10-CM | POA: Diagnosis not present

## 2021-01-21 DIAGNOSIS — R7301 Impaired fasting glucose: Secondary | ICD-10-CM | POA: Diagnosis not present

## 2021-01-21 DIAGNOSIS — E7800 Pure hypercholesterolemia, unspecified: Secondary | ICD-10-CM | POA: Diagnosis not present

## 2021-01-21 DIAGNOSIS — Z0000 Encounter for general adult medical examination without abnormal findings: Secondary | ICD-10-CM

## 2021-01-21 DIAGNOSIS — I10 Essential (primary) hypertension: Secondary | ICD-10-CM

## 2021-01-21 DIAGNOSIS — E669 Obesity, unspecified: Secondary | ICD-10-CM

## 2021-01-21 MED ORDER — LISINOPRIL 2.5 MG PO TABS: 2.5000 mg | ORAL_TABLET | Freq: Every day | ORAL | 4 refills | Status: DC

## 2021-01-21 MED ORDER — ATORVASTATIN CALCIUM 20 MG PO TABS: 20.0000 mg | ORAL_TABLET | Freq: Every day | ORAL | 4 refills | Status: DC

## 2021-01-21 NOTE — Progress Notes (Signed)
BP 123/75   Pulse 83   Temp 99 F (37.2 C) (Oral)   Ht '5\' 6"'$  (1.676 m)   Wt 192 lb 3.2 oz (87.2 kg)   SpO2 99%   BMI 31.02 kg/m    Subjective:    Patient ID: Colton Brown, male    DOB: Mar 10, 1956, 65 y.o.   MRN: [id number]  HPI: Colton Brown is a 65 y.o. male presenting on 01/21/2021 for comprehensive medical examination. Current medical complaints include:none  He currently lives with: none Interim Problems from his last visit: no  HYPERTENSION / [place] Satisfied with current treatment? yes Duration of hypertension: chronic BP monitoring frequency: monthly BP range: 112/68 last night HR 68 BP medication side effects: no Duration of hyperlipidemia: chronic Cholesterol medication side effects: no Cholesterol supplements: none Medication compliance: good compliance Aspirin: yes Recent stressors: no Recurrent headaches: no Visual changes: no Palpitations: no Dyspnea: no Chest pain: no Lower extremity edema: no Dizzy/lightheaded: no   Functional Status Survey: Is the patient deaf or have difficulty hearing?: No Does the patient have difficulty seeing, even when wearing glasses/contacts?: No Does the patient have difficulty concentrating, remembering, or making decisions?: No Does the patient have difficulty walking or climbing stairs?: No Does the patient have difficulty dressing or bathing?: No Does the patient have difficulty doing errands alone such as visiting a doctor's office or shopping?: No  FALL RISK: Fall Risk  01/21/2021 01/10/2020 10/06/2018 09/16/2017 09/14/2016  Falls in the past year? 0 0 0 No No  Number falls in past yr: 0 0 0 - -  Injury with Fall? 0 0 0 - -  Risk for fall due to : No Fall Risks No Fall Risks - - -  Follow up Education provided Falls evaluation completed - - -    Depression Screen Depression screen [facility] 2/9 01/21/2021 01/10/2020 10/06/2018 09/16/2017 09/14/2016  Decreased Interest 0 0 0 0 0  Down, Depressed, Hopeless 0 0 0 0 0  PHQ  - 2 Score 0 0 0 0 0  Altered sleeping - - 3 - -  Tired, decreased energy - - 0 - -  Change in appetite - - 0 - -  Feeling bad or failure about yourself  - - 0 - -  Trouble concentrating - - 0 - -  Moving slowly or fidgety/restless - - 0 - -  Suicidal thoughts - - 0 - -  PHQ-9 Score - - 3 - -    Advanced Directives <no information>  Past Medical History:  Past Medical History:  Diagnosis Date   Chronic kidney disease    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   GERD (gastroesophageal reflux disease)    DUE TO GALLBLADDER-NO MEDS   Hyperlipidemia    Lumbago    Psoriasis     Surgical History:  Past Surgical History:  Procedure Laterality Date   ANKLE SURGERY Left [place] N/A 11/11/2015   Procedure: LAPAROSCOPIC CHOLECYSTECTOMY WITH INTRAOPERATIVE CHOLANGIOGRAM;  Surgeon: Hubbard Robinson, MD;  Location: [facility] ORS;  Service: General;  Laterality: N/A;   COLONOSCOPY WITH PROPOFOL N/A 11/02/2016   Procedure: COLONOSCOPY WITH PROPOFOL;  Surgeon: Lucilla Lame, MD;  Location: [facility] ENDOSCOPY;  Service: Endoscopy;  Laterality: N/A;   LUMBAR DISC SURGERY     L4-L5   WISDOM TOOTH EXTRACTION      Medications:  Current Outpatient Medications on File Prior to Visit  Medication Sig   aspirin 81 MG tablet Take 81  mg by mouth daily.   Turmeric 500 MG CAPS Take 1 capsule by mouth daily.   No current facility-administered medications on file prior to visit.    Allergies:  No Known Allergies  Social History:  Social History   Socioeconomic History   Marital status: Divorced    Spouse name: Not on file   Number of children: Not on file   Years of education: Not on file   Highest education level: Not on file  Occupational History   Not on file  Tobacco Use   Smoking status: Never   Smokeless tobacco: Never  Vaping Use   Vaping Use: Never used  Substance and Sexual Activity   Alcohol use: No    Alcohol/week: 0.0 standard drinks   Drug use: No    Sexual activity: Yes  Other Topics Concern   Not on file  Social History Narrative   Not on file   Social Determinants of Health   Financial Resource Strain: Not on file  Food Insecurity: Not on file  Transportation Needs: Not on file  Physical Activity: Not on file  Stress: Not on file  Social Connections: Not on file  Intimate Partner Violence: Not on file   Social History   Tobacco Use  Smoking Status Never  Smokeless Tobacco Never   Social History   Substance and Sexual Activity  Alcohol Use No   Alcohol/week: 0.0 standard drinks    Family History:  Family History  Problem Relation Age of Onset   Asthma Mother    Stroke Mother    Migraines Mother    Thyroid disease Mother    Diabetes Mother    Heart disease Father    Hyperlipidemia Father    Hypertension Father    Stroke Father    Hyperlipidemia Brother    Hypertension Brother    Diabetes Maternal Grandmother    Diabetes Maternal Grandfather    Heart disease Paternal Grandmother     Past medical history, surgical history, medications, allergies, family history and social history reviewed with patient today and changes made to appropriate areas of the chart.   Review of Systems - negative All other ROS negative except what is listed above and in the HPI.      Objective:    BP 123/75   Pulse 83   Temp 99 F (37.2 C) (Oral)   Ht '5\' 6"'$  (1.676 m)   Wt 192 lb 3.2 oz (87.2 kg)   SpO2 99%   BMI 31.02 kg/m   Wt Readings from Last 3 Encounters:  01/21/21 192 lb 3.2 oz (87.2 kg)  07/15/20 193 lb 6.4 oz (87.7 kg)  01/10/20 190 lb 6.4 oz (86.4 kg)    Physical Exam Vitals and nursing note reviewed.  Constitutional:      General: He is awake. He is not in acute distress.    Appearance: He is well-developed and well-groomed. He is not ill-appearing.  HENT:     Head: Normocephalic and atraumatic.     Right Ear: Hearing, tympanic membrane, ear canal and external ear normal. No drainage.     Left Ear:  Hearing, tympanic membrane, ear canal and external ear normal. No drainage.     Nose: Nose normal.     Mouth/Throat:     Pharynx: Uvula midline.  Eyes:     General: Lids are normal.        Right eye: No discharge.        Left eye: No discharge.  Extraocular Movements: Extraocular movements intact.     Conjunctiva/sclera: Conjunctivae normal.     Pupils: Pupils are equal, round, and reactive to light.     Visual Fields: Right eye visual fields normal and left eye visual fields normal.  Neck:     Thyroid: No thyromegaly.     Vascular: No carotid bruit or JVD.     Trachea: Trachea normal.  Cardiovascular:     Rate and Rhythm: Normal rate and regular rhythm.     Heart sounds: Normal heart sounds, S1 normal and S2 normal. No murmur heard.   No gallop.  Pulmonary:     Effort: Pulmonary effort is normal. No accessory muscle usage or respiratory distress.     Breath sounds: Normal breath sounds.  Abdominal:     General: Bowel sounds are normal.     Palpations: Abdomen is soft. There is no hepatomegaly or splenomegaly.     Tenderness: There is no abdominal tenderness.  Musculoskeletal:        General: Normal range of motion.     Cervical back: Normal range of motion and neck supple.     Right lower leg: No edema.     Left lower leg: No edema.  Lymphadenopathy:     Head:     Right side of head: No submental, submandibular, tonsillar, preauricular or posterior auricular adenopathy.     Left side of head: No submental, submandibular, tonsillar, preauricular or posterior auricular adenopathy.     Cervical: No cervical adenopathy.  Skin:    General: Skin is warm and dry.     Capillary Refill: Capillary refill takes less than 2 seconds.     Findings: No rash.  Neurological:     Mental Status: He is alert and oriented to person, place, and time.     Cranial Nerves: Cranial nerves are intact.     Gait: Gait is intact.     Deep Tendon Reflexes: Reflexes are normal and symmetric.      Reflex Scores:      Brachioradialis reflexes are 2+ on the right side and 2+ on the left side.      Patellar reflexes are 2+ on the right side and 2+ on the left side. Psychiatric:        Attention and Perception: Attention normal.        Mood and Affect: Mood normal.        Speech: Speech normal.        Behavior: Behavior normal. Behavior is cooperative.        Thought Content: Thought content normal.        Cognition and Memory: Cognition normal.        Judgment: Judgment normal.   Results for orders placed or performed in visit on 07/15/20  Microalbumin, Urine Waived  Result Value Ref Range   Microalb, Ur Waived 80 (H) 0 - 19 mg/L   Creatinine, Urine Waived 200 10 - 300 mg/dL   Microalb/Creat Ratio 30-300 (H) <30 mg/g      Assessment & Plan:   Problem List Items Addressed This Visit       Cardiovascular and Mediastinum   Essential hypertension, benign   Relevant Medications   lisinopril (ZESTRIL) 2.5 MG tablet   atorvastatin (LIPITOR) 20 MG tablet     Other   Hyperlipidemia   Relevant Medications   lisinopril (ZESTRIL) 2.5 MG tablet   atorvastatin (LIPITOR) 20 MG tablet  Discussed aspirin prophylaxis for myocardial infarction prevention and decision was  made to continue ASA  LABORATORY TESTING:  Health maintenance labs ordered today as discussed above.   The natural history of prostate cancer and ongoing controversy regarding screening and potential treatment outcomes of prostate cancer has been discussed with the patient. The meaning of a false positive PSA and a false negative PSA has been discussed. He indicates understanding of the limitations of this screening test and wishes to proceed with screening PSA testing.   IMMUNIZATIONS:   - Tdap: Tetanus vaccination status reviewed: last tetanus booster within 10 years. - Influenza: Up to date - Pneumovax: Not applicable - Prevnar: Not applicable - Zostavax vaccine: Up to date  SCREENING: - Colonoscopy: Up to  date  Discussed with patient purpose of the colonoscopy is to detect colon cancer at curable precancerous or early stages   - AAA Screening: Not applicable  -Hearing Test: Not applicable  -Spirometry: Not applicable   PATIENT COUNSELING:    Sexuality: Discussed sexually transmitted diseases, partner selection, use of condoms, avoidance of unintended pregnancy  and contraceptive alternatives.   Advised to avoid cigarette smoking.  I discussed with the patient that most people either abstain from alcohol or drink within safe limits (<=14/week and <=4 drinks/occasion for males, <=7/weeks and <= 3 drinks/occasion for females) and that the risk for alcohol disorders and other health effects rises proportionally with the number of drinks per week and how often a drinker exceeds daily limits.  Discussed cessation/primary prevention of drug use and availability of treatment for abuse.   Diet: Encouraged to adjust caloric intake to maintain  or achieve ideal body weight, to reduce intake of dietary saturated fat and total fat, to limit sodium intake by avoiding high sodium foods and not adding table salt, and to maintain adequate dietary potassium and calcium preferably from fresh fruits, vegetables, and low-fat dairy products.    Stressed the importance of regular exercise  Injury prevention: Discussed safety belts, safety helmets, smoke detector, smoking near bedding or upholstery.   Dental health: Discussed importance of regular tooth brushing, flossing, and dental visits.   Follow up plan: NEXT PREVENTATIVE PHYSICAL DUE IN 1 YEAR. No follow-ups on file.

## 2021-01-21 NOTE — Assessment & Plan Note (Signed)
BMI 31.02.  Recommended eating smaller high protein, low fat meals more frequently and exercising 30 mins a day 5 times a week with a goal of 10-15lb weight loss in the next 3 months. Patient voiced their understanding and motivation to adhere to these recommendations.

## 2021-01-21 NOTE — Assessment & Plan Note (Signed)
Chronic, ongoing.  Continue current medication regimen and adjust as needed.  Continue diet focus.  Obtain lipid panel and CMP today.

## 2021-01-21 NOTE — Assessment & Plan Note (Signed)
Chronic, stable with BP at goal in office and at home.  Continue current medication regimen and adjust as needed.  Recommend checking BP at home at least a few mornings a week and documenting.  DASH diet focus.  CMP, CBC, TSH today.  Return in 6 months.  Refills sent in

## 2021-01-21 NOTE — Patient Instructions (Signed)

## 2021-01-21 NOTE — Assessment & Plan Note (Signed)
A1C 5.7% last check, remaining stable with no increase.  Continue to monitor and focus on diet.

## 2021-01-22 LAB — COMPREHENSIVE METABOLIC PANEL
ALT: 35 IU/L (ref 0–44)
AST: 20 IU/L (ref 0–40)
Albumin/Globulin Ratio: 1.9 (ref 1.2–2.2)
Albumin: 4.5 g/dL (ref 3.8–4.8)
Alkaline Phosphatase: 71 IU/L (ref 44–121)
BUN/Creatinine Ratio: 10 (ref 10–24)
BUN: 10 mg/dL (ref 8–27)
Bilirubin Total: 0.9 mg/dL (ref 0.0–1.2)
CO2: 25 mmol/L (ref 20–29)
Calcium: 9.3 mg/dL (ref 8.6–10.2)
Chloride: 100 mmol/L (ref 96–106)
Creatinine, Ser: 0.98 mg/dL (ref 0.76–1.27)
Globulin, Total: 2.4 g/dL (ref 1.5–4.5)
Glucose: 111 mg/dL — ABNORMAL HIGH (ref 65–99)
Potassium: 4.3 mmol/L (ref 3.5–5.2)
Sodium: 139 mmol/L (ref 134–144)
Total Protein: 6.9 g/dL (ref 6.0–8.5)
eGFR: 87 mL/min/{1.73_m2} (ref >59)

## 2021-01-22 LAB — LIPID PANEL W/O CHOL/HDL RATIO
Cholesterol, Total: 157 mg/dL (ref 100–199)
HDL: 48 mg/dL (ref >39)
LDL Chol Calc (NIH): 81 mg/dL (ref 0–99)
Triglycerides: 165 mg/dL — ABNORMAL HIGH (ref 0–149)
VLDL Cholesterol Cal: 28 mg/dL (ref 5–40)

## 2021-01-22 LAB — CBC WITH DIFFERENTIAL/PLATELET
Basophils Absolute: 0.1 10*3/uL (ref 0.0–0.2)
Basos: 1 %
EOS (ABSOLUTE): 0.1 10*3/uL (ref 0.0–0.4)
Eos: 1 %
Hematocrit: 47.9 % (ref 37.5–51.0)
Hemoglobin: 16.6 g/dL (ref 13.0–17.7)
Immature Grans (Abs): 0 10*3/uL (ref 0.0–0.1)
Immature Granulocytes: 0 %
Lymphocytes Absolute: 1.1 10*3/uL (ref 0.7–3.1)
Lymphs: 12 %
MCH: 29.9 pg (ref 26.6–33.0)
MCHC: 34.7 g/dL (ref 31.5–35.7)
MCV: 86 fL (ref 79–97)
Monocytes Absolute: 0.5 10*3/uL (ref 0.1–0.9)
Monocytes: 6 %
Neutrophils Absolute: 7.2 10*3/uL — ABNORMAL HIGH (ref 1.4–7.0)
Neutrophils: 80 %
Platelets: 143 10*3/uL — ABNORMAL LOW (ref 150–450)
RBC: 5.55 x10E6/uL (ref 4.14–5.80)
RDW: 13.3 % (ref 11.6–15.4)
WBC: 9 10*3/uL (ref 3.4–10.8)

## 2021-01-22 LAB — PSA: Prostate Specific Ag, Serum: 1.6 ng/mL (ref 0.0–4.0)

## 2021-01-22 LAB — TSH: TSH: 2.19 u[IU]/mL (ref 0.450–4.500)

## 2021-01-22 LAB — HEMOGLOBIN A1C
Est. average glucose Bld gHb Est-mCnc: 126 mg/dL
Hgb A1c MFr Bld: 6 % — ABNORMAL HIGH (ref 4.8–5.6)

## 2021-01-22 NOTE — Progress Notes (Signed)
Contacted via MyChart   Good morning Colton Brown, your labs have returned: - The A1C is the diabetes testing we talk about, this looks at your blood sugars over the past 3 months and turns the average into a number.  Your number is 6.0%, meaning you are still prediabetic and this has trended up a little.  Any number 5.7 to 6.4 is considered prediabetes and any number 6.5 or greater is considered diabetes.  I would recommend heavy focus on decreasing foods high in sugar and your intake of things like bread products, pasta, and rice.  The American Diabetes Association online has a large amount of information on diet changes to make.  We will recheck this number next visit. - CBC shows some mild decrease in platelets which has been seen on occasion in past, we will continue to monitor this. - Kidney function, creatinine and eGFR, is normal, as is liver function, AST and ALT. - Thyroid level and prostate lab normal - Cholesterol levels show LDL close to goal, would like to see <70.  Continue Atorvastatin daily.  Any questions? Keep being stellar!!  Thank you for allowing me to participate in your care.  I appreciate you. Kindest regards, Caelum Federici

## 2021-07-19 DIAGNOSIS — D696 Thrombocytopenia, unspecified: Secondary | ICD-10-CM | POA: Insufficient documentation

## 2021-07-19 NOTE — Patient Instructions (Signed)

## 2021-07-21 ENCOUNTER — Ambulatory Visit: Payer: BC Managed Care – PPO | Admitting: Nurse Practitioner

## 2021-07-21 ENCOUNTER — Other Ambulatory Visit: Payer: Self-pay

## 2021-07-21 ENCOUNTER — Encounter: Payer: Self-pay | Admitting: Nurse Practitioner

## 2021-07-21 VITALS — BP 132/84 | HR 71 | Temp 98.2°F | Ht 66.0 in | Wt 190.4 lb

## 2021-07-21 DIAGNOSIS — D696 Thrombocytopenia, unspecified: Secondary | ICD-10-CM

## 2021-07-21 DIAGNOSIS — I10 Essential (primary) hypertension: Secondary | ICD-10-CM | POA: Diagnosis not present

## 2021-07-21 DIAGNOSIS — R7301 Impaired fasting glucose: Secondary | ICD-10-CM

## 2021-07-21 DIAGNOSIS — E7800 Pure hypercholesterolemia, unspecified: Secondary | ICD-10-CM | POA: Diagnosis not present

## 2021-07-21 DIAGNOSIS — E669 Obesity, unspecified: Secondary | ICD-10-CM

## 2021-07-21 DIAGNOSIS — R809 Proteinuria, unspecified: Secondary | ICD-10-CM | POA: Insufficient documentation

## 2021-07-21 DIAGNOSIS — R808 Other proteinuria: Secondary | ICD-10-CM

## 2021-07-21 LAB — BAYER DCA HB A1C WAIVED: HB A1C (BAYER DCA - WAIVED): 5.7 % — ABNORMAL HIGH (ref 4.8–5.6)

## 2021-07-21 LAB — MICROALBUMIN, URINE WAIVED
Creatinine, Urine Waived: 100 mg/dL (ref 10–300)
Microalb, Ur Waived: 150 mg/L — ABNORMAL HIGH (ref 0–19)
Microalb/Creat Ratio: >300 mg/g — ABNORMAL HIGH (ref <30)

## 2021-07-21 NOTE — Assessment & Plan Note (Signed)
Noted intermittently on past labs, does take Baby ASA daily. Level often in 140 range when low.  Recheck CBC today and monitor closely.

## 2021-07-21 NOTE — Assessment & Plan Note (Addendum)
A1c 6% last check, remaining stable with no increase -- recheck this with urine ALB today.  Continue to monitor and focus on diet.  Could consider Metformin, which discussed with patient.

## 2021-07-21 NOTE — Assessment & Plan Note (Signed)
BMI 30.73.  Recommended eating smaller high protein, low fat meals more frequently and exercising 30 mins a day 5 times a week with a goal of 10-15lb weight loss in the next 3 months. Patient voiced their understanding and motivation to adhere to these recommendations.

## 2021-07-21 NOTE — Progress Notes (Addendum)
BP 132/84    Pulse 71    Temp 98.2 F (36.8 C) (Oral)    Ht 5' 6" (1.676 m)    Wt 190 lb 6.4 oz (86.4 kg)    SpO2 99%    BMI 30.73 kg/m    Subjective:    Patient ID: Colton Brown, male    DOB: July 13, 1955, 67 y.o.   MRN: [id number]  HPI: Colton Brown is a 66 y.o. male  Chief Complaint  Patient presents with   Hyperlipidemia   Hypertension   HYPERTENSION / HYPERLIPIDEMIA Continues on Lisinopril and Atorvastatin + ASA.  Occasionally has lower platelets on labs, last check 143. Satisfied with current treatment? yes Duration of hypertension: chronic BP monitoring frequency: occasional BP range: 116/73 this morning -- 110/70 range often at home BP medication side effects: no Duration of hyperlipidemia: chronic Cholesterol medication side effects: no Cholesterol supplements: none Medication compliance: good compliance Aspirin: yes Recent stressors: no Recurrent headaches: no Visual changes: no Palpitations: no Dyspnea: no Chest pain: no Lower extremity edema: no Dizzy/lightheaded: no  IFG: A1c range 5.7 to 6%, recent in August 6%. Polydipsia/polyuria: no Visual disturbance: no Chest pain: no Paresthesias: no  Depression screen [facility] 2/9 07/21/2021 01/21/2021 01/10/2020 10/06/2018 09/16/2017  Decreased Interest 0 0 0 0 0  Down, Depressed, Hopeless 0 0 0 0 0  PHQ - 2 Score 0 0 0 0 0  Altered sleeping 0 - - 3 -  Tired, decreased energy 0 - - 0 -  Change in appetite 0 - - 0 -  Feeling bad or failure about yourself  0 - - 0 -  Trouble concentrating 0 - - 0 -  Moving slowly or fidgety/restless 0 - - 0 -  Suicidal thoughts 0 - - 0 -  PHQ-9 Score 0 - - 3 -  Difficult doing work/chores Not difficult at all - - - -    Relevant past medical, surgical, family and social history reviewed and updated as indicated. Interim medical history since our last visit reviewed. Allergies and medications reviewed and updated.  Review of Systems  Constitutional:  Negative for activity change,  diaphoresis, fatigue and fever.  Respiratory:  Negative for cough, chest tightness, shortness of breath and wheezing.   Cardiovascular:  Negative for chest pain, palpitations and leg swelling.  Gastrointestinal: Negative.   Endocrine: Negative for polydipsia, polyphagia and polyuria.  Neurological: Negative.   Psychiatric/Behavioral: Negative.     Per HPI unless specifically indicated above     Objective:    BP 132/84    Pulse 71    Temp 98.2 F (36.8 C) (Oral)    Ht 5' 6" (1.676 m)    Wt 190 lb 6.4 oz (86.4 kg)    SpO2 99%    BMI 30.73 kg/m   Wt Readings from Last 3 Encounters:  07/21/21 190 lb 6.4 oz (86.4 kg)  01/21/21 192 lb 3.2 oz (87.2 kg)  07/15/20 193 lb 6.4 oz (87.7 kg)    Physical Exam Vitals and nursing note reviewed.  Constitutional:      General: He is awake. He is not in acute distress.    Appearance: He is well-developed and well-groomed. He is obese. He is not ill-appearing.  HENT:     Head: Normocephalic and atraumatic.     Right Ear: Hearing normal. No drainage.     Left Ear: Hearing normal. No drainage.  Eyes:     General: Lids are normal.  Right eye: No discharge.        Left eye: No discharge.     Conjunctiva/sclera: Conjunctivae normal.     Pupils: Pupils are equal, round, and reactive to light.  Neck:     Thyroid: No thyromegaly.     Vascular: No carotid bruit.     Trachea: Trachea normal.  Cardiovascular:     Rate and Rhythm: Normal rate and regular rhythm.     Heart sounds: Normal heart sounds, S1 normal and S2 normal. No murmur heard.   No gallop.  Pulmonary:     Effort: Pulmonary effort is normal. No accessory muscle usage or respiratory distress.     Breath sounds: Normal breath sounds.  Abdominal:     General: Bowel sounds are normal.     Palpations: Abdomen is soft. There is no hepatomegaly or splenomegaly.  Musculoskeletal:        General: Normal range of motion.     Cervical back: Normal range of motion and neck supple.      Right lower leg: No edema.     Left lower leg: No edema.  Skin:    General: Skin is warm and dry.     Capillary Refill: Capillary refill takes less than 2 seconds.     Findings: No rash.  Neurological:     Mental Status: He is alert and oriented to person, place, and time.     Deep Tendon Reflexes: Reflexes are normal and symmetric.  Psychiatric:        Attention and Perception: Attention normal.        Mood and Affect: Mood normal.        Speech: Speech normal.        Behavior: Behavior normal. Behavior is cooperative.        Thought Content: Thought content normal.   Results for orders placed or performed in visit on 01/21/21  HgB A1c  Result Value Ref Range   Hgb A1c MFr Bld 6.0 (H) 4.8 - 5.6 %   Est. average glucose Bld gHb Est-mCnc 126 mg/dL  CBC with Differential/Platelet  Result Value Ref Range   WBC 9.0 3.4 - 10.8 x10E3/uL   RBC 5.55 4.14 - 5.80 x10E6/uL   Hemoglobin 16.6 13.0 - 17.7 g/dL   Hematocrit 47.9 37.5 - 51.0 %   MCV 86 79 - 97 fL   MCH 29.9 26.6 - 33.0 pg   MCHC 34.7 31.5 - 35.7 g/dL   RDW 13.3 11.6 - 15.4 %   Platelets 143 (L) 150 - 450 x10E3/uL   Neutrophils 80 Not Estab. %   Lymphs 12 Not Estab. %   Monocytes 6 Not Estab. %   Eos 1 Not Estab. %   Basos 1 Not Estab. %   Neutrophils Absolute 7.2 (H) 1.4 - 7.0 x10E3/uL   Lymphocytes Absolute 1.1 0.7 - 3.1 x10E3/uL   Monocytes Absolute 0.5 0.1 - 0.9 x10E3/uL   EOS (ABSOLUTE) 0.1 0.0 - 0.4 x10E3/uL   Basophils Absolute 0.1 0.0 - 0.2 x10E3/uL   Immature Granulocytes 0 Not Estab. %   Immature Grans (Abs) 0.0 0.0 - 0.1 x10E3/uL  Comprehensive metabolic panel  Result Value Ref Range   Glucose 111 (H) 65 - 99 mg/dL   BUN 10 8 - 27 mg/dL   Creatinine, Ser 0.98 0.76 - 1.27 mg/dL   eGFR 87 >59 mL/min/1.73   BUN/Creatinine Ratio 10 10 - 24   Sodium 139 134 - 144 mmol/L   Potassium 4.3 3.5 -  5.2 mmol/L   Chloride 100 96 - 106 mmol/L   CO2 25 20 - 29 mmol/L   Calcium 9.3 8.6 - 10.2 mg/dL   Total Protein  6.9 6.0 - 8.5 g/dL   Albumin 4.5 3.8 - 4.8 g/dL   Globulin, Total 2.4 1.5 - 4.5 g/dL   Albumin/Globulin Ratio 1.9 1.2 - 2.2   Bilirubin Total 0.9 0.0 - 1.2 mg/dL   Alkaline Phosphatase 71 44 - 121 IU/L   AST 20 0 - 40 IU/L   ALT 35 0 - 44 IU/L  TSH  Result Value Ref Range   TSH 2.190 0.450 - 4.500 uIU/mL  PSA  Result Value Ref Range   Prostate Specific Ag, Serum 1.6 0.0 - 4.0 ng/mL  Lipid Panel w/o Chol/HDL Ratio  Result Value Ref Range   Cholesterol, Total 157 100 - 199 mg/dL   Triglycerides 165 (H) 0 - 149 mg/dL   HDL 48 >39 mg/dL   VLDL Cholesterol Cal 28 5 - 40 mg/dL   LDL Chol Calc (NIH) 81 0 - 99 mg/dL      Assessment & Plan:   Problem List Items Addressed This Visit       Cardiovascular and Mediastinum   Essential hypertension, benign    Chronic, stable with BP at goal in office and at home.  Continue current medication regimen and adjust as needed.  Recommend checking BP at home at least a few mornings a week and documenting.  DASH diet focus.  CBC and BMP today.  Return in 6 months.         Relevant Orders   Basic metabolic panel     Endocrine   Impaired fasting blood sugar    A1c 6% last check, remaining stable with no increase -- recheck this with urine ALB today.  Continue to monitor and focus on diet.  Could consider Metformin, which discussed with patient.      Relevant Orders   Bayer DCA Hb A1c Waived   Microalbumin, Urine Waived     Hematopoietic and Hemostatic   Thrombocytopenia ([place]) - Primary    Noted intermittently on past labs, does take Baby ASA daily. Level often in 140 range when low.  Recheck CBC today and monitor closely.      Relevant Orders   CBC with Differential/Platelet     Other   Hyperlipidemia    Chronic, ongoing.  Continue current medication regimen and adjust as needed.  Continue heavy focus on diet.  Obtain lipid panel today.      Relevant Orders   Lipid Panel w/o Chol/HDL Ratio   Obesity (BMI 30.0-34.9)    BMI 30.73.   Recommended eating smaller high protein, low fat meals more frequently and exercising 30 mins a day 5 times a week with a goal of 10-15lb weight loss in the next 3 months. Patient voiced their understanding and motivation to adhere to these recommendations.       Proteinuria    Noted on labs today = 150.  Continue ACE for kidney protection and monitor closely.  Consider kidney ultrasound if persistent and possible referral to nephrology.        Follow up plan: Return in about 6 months (around 01/22/2022) for Annual physical.

## 2021-07-21 NOTE — Assessment & Plan Note (Signed)
Chronic, stable with BP at goal in office and at home.  Continue current medication regimen and adjust as needed.  Recommend checking BP at home at least a few mornings a week and documenting.  DASH diet focus.  CBC and BMP today.  Return in 6 months.

## 2021-07-21 NOTE — Assessment & Plan Note (Signed)
Noted on labs today = 150.  Continue ACE for kidney protection and monitor closely.  Consider kidney ultrasound if persistent and possible referral to nephrology.

## 2021-07-21 NOTE — Assessment & Plan Note (Addendum)
Chronic, ongoing.  Continue current medication regimen and adjust as needed.  Continue heavy focus on diet.  Obtain lipid panel today.

## 2021-07-22 LAB — CBC WITH DIFFERENTIAL/PLATELET
Basophils Absolute: 0.1 10*3/uL (ref 0.0–0.2)
Basos: 1 %
EOS (ABSOLUTE): 0.2 10*3/uL (ref 0.0–0.4)
Eos: 2 %
Hematocrit: 48.7 % (ref 37.5–51.0)
Hemoglobin: 16.6 g/dL (ref 13.0–17.7)
Immature Grans (Abs): 0 10*3/uL (ref 0.0–0.1)
Immature Granulocytes: 0 %
Lymphocytes Absolute: 1.4 10*3/uL (ref 0.7–3.1)
Lymphs: 23 %
MCH: 29.6 pg (ref 26.6–33.0)
MCHC: 34.1 g/dL (ref 31.5–35.7)
MCV: 87 fL (ref 79–97)
Monocytes Absolute: 0.6 10*3/uL (ref 0.1–0.9)
Monocytes: 10 %
Neutrophils Absolute: 4 10*3/uL (ref 1.4–7.0)
Neutrophils: 64 %
Platelets: 139 10*3/uL — ABNORMAL LOW (ref 150–450)
RBC: 5.61 x10E6/uL (ref 4.14–5.80)
RDW: 13.3 % (ref 11.6–15.4)
WBC: 6.2 10*3/uL (ref 3.4–10.8)

## 2021-07-22 LAB — BASIC METABOLIC PANEL
BUN/Creatinine Ratio: 10 (ref 10–24)
BUN: 9 mg/dL (ref 8–27)
CO2: 24 mmol/L (ref 20–29)
Calcium: 9.6 mg/dL (ref 8.6–10.2)
Chloride: 100 mmol/L (ref 96–106)
Creatinine, Ser: 0.86 mg/dL (ref 0.76–1.27)
Glucose: 111 mg/dL — ABNORMAL HIGH (ref 70–99)
Potassium: 4.2 mmol/L (ref 3.5–5.2)
Sodium: 140 mmol/L (ref 134–144)
eGFR: 97 mL/min/{1.73_m2} (ref >59)

## 2021-07-22 LAB — LIPID PANEL W/O CHOL/HDL RATIO
Cholesterol, Total: 162 mg/dL (ref 100–199)
HDL: 46 mg/dL (ref >39)
LDL Chol Calc (NIH): 90 mg/dL (ref 0–99)
Triglycerides: 146 mg/dL (ref 0–149)
VLDL Cholesterol Cal: 26 mg/dL (ref 5–40)

## 2021-07-22 NOTE — Progress Notes (Signed)
Contacted via MyChart   Good evening Mr. Mehring, your labs have returned and overall remain stable and at baseline for you.  Kidney function, creatinine and eGFR, remains normal. Platelet level remains on lower side, but stable.  I would cut back on Baby Aspirin to every other day.  Any questions? Keep being awesome!!  Thank you for allowing me to participate in your care.  I appreciate you. Kindest regards, Delorus Langwell

## 2021-08-31 ENCOUNTER — Ambulatory Visit: Payer: BLUE CROSS/BLUE SHIELD

## 2021-09-07 NOTE — Consults
PATIENT:  David Logan   MRN:  [id number]  DOB:  03-04-1956  DATE OF SERVICE:  09/14/2021  PRIMARY CARE PROVIDER: No primary care provider on file.    CHIEF COMPLAINT:   Vasectomy evaluation    I had the privilege of seeing the patient for initial consultation in my [facility] practice today for vasectomy consult.    HISTORY OF PRESENT ILLNESS     David Logan is a 66 y.o. male who desires elective vasectomy for surgical sterilization. He has *** children. He has read about the procedure, appears well informed and willing to proceed. He *** testicular or pelvic pain or trauma history. His spouse also agrees to the procedure and understands the relatively permanent nature of vasectomy.      PAST MEDICAL HISTORY     No past medical history on file.    PAST SURGICAL HISTORY     No past surgical history on file.    MEDICATIONS     No current outpatient medications on file.     No current facility-administered medications for this visit.        ALLERGIES     No Active Allergies     SOCIAL HISTORY     Social History     Socioeconomic History   ? Marital status: Married       Occupation: ***    FAMILY HISTORY     No family history on file.     REVIEW OF SYSTEMS     Review of Systems: A detailed 14 point review of systems was ascertained today including: endocrine, integument, cardiovascular, pulmonary, GI, urinary, hematologic, neurologic/psychiatric. All systems negative except as documented above.  Pertinent positives include: none unless otherwise listed in HPI.     PHYSICAL EXAM       Patient declined chaperone.       General:  The patient is a well-developed and well nourished male in no acute distress with normal attention to grooming. The patient is awake, alert and cooperative.    HEENT: normocephalic, PERRLA, normal ear and nose structures, with no scars or lesions.    Skin: warm, dry with normal turgor, with no lesions seen.    Neck: supple, no bruits, no lymph nodes palpable.    Lungs: normal respiratory effort.    Chest/ Heart: regular heart rate. *** gynecomastia.    Abdomen:  The patient has no palpable masses. The abdomen has *** tenderness. No hepatosplenomegaly. no hernia.    Back: no CVA tenderness.     Suprapubic area: no tenderness over the bladder. Nondistended.    Genitourinary:     Penis:  ***circumcised phallus, meatus at the tip of the penis with normal caliber. No lesions on the shaft or glans of the penis.     Scrotum: No rashes or lesions.    Left testicle: ***cc. No tenderness, no masses.    Left spermatic cord: *** varicocele, no hydrocele, vas deferens palpable.    Left epididymis: No tenderness. No spermatocele.     Right testicle: ***cc.  No tenderness, no masses.    Right spermatic cord: *** varicocele,  No hydrocele, vas deferens palpable.    Right epididymis: No tenderness. No spermatocele.    Inguinal region: No hernia. No adenopathy.     Extremities: Upper with no deformities. Lower - no edema.    Neurologic: The patient is oriented to person, place and time, and has normal mood and affect. The patient is dressed appropriately for the weather.  VITALS:   Temp Readings from Last 3 Encounters:   No data found for Temp      BP Readings from Last 3 Encounters:   No data found for BP     Resp Readings from Last 3 Encounters:   No data found for Resp     PF Readings from Last 3 Encounters:   No data found for PF       LABORATORY DATA     No results found for: WBC, HGB, HCT, MCV, PLT    No results found for: NA  No results found for: K  No results found for: CL  No results found for: CO2  No results found for: BUN  No results found for: CREAT  No results found for: GLUCOSE    SOCIAL STRESSORS     ***  Z56.3 WORK STRESS  Z60.2 LIVES ALONE  Z56.0 UNEMPLOYMENT  Z56.1 CHANGE OF JOB  Z56.2 THREAT OF JOB LOSS  Z56.4 DISCORD WITH BOSS  Z56.5 PHYSICAL/MENTAL WORK STRAIN  Z56.89 OTHER PROBLEMS RELATED TO WORK    Appropriate handouts and or verbal counseling given at this visit.      IMPRESSION Desires vasectomy    RECOMMENDATION     We discussed the vasectomy procedure in detail. I perform vasectomies through bilateral incisions, approximately 1 cm long, using no scalpel vasectomy instruments.  I perform spermatic cord blocks to minimize procedural and post-procedural discomfort.  I discussed with him that remaining sedentary for 3 days after the procedure with ice packs for the first 24-48 hours helps significantly with swelling and pain.  After 1 week, most men return to normal activity.  I discussed there is a very low failure rate, somewhere around 1 in 1,000, and that a vasectomy is by far the most definitive minimally invasive birth control option for men and women both.  I discussed that vasectomies are considered permanent sterility but can be reversed with varying degrees of success.  I did discuss the very rare phenomenon of post-vasectomy pain syndrome which can be debilitating and its mechanism is not clearly understood. This can often be alleviated with a reversal. With a vasectomy, there is no effect on urination, testosterone production, or ejaculation. I told him that he should expect to be here for about 45-60 minutes, although the procedure itself only takes about 15 minutes. Finally, we discussed we are able to offer our patients the option of using nitrous oxide gas (''laughing gas'') during the procedure to help alleviate pain and anxiety, and that this is not covered by insurance but available for an extra fee and can be decided upon day of the procedure. He will let me know when he would like to schedule the procedure.    I told the patient that 8 weeks after the vasectomy he will come back for a semen analysis. As per national guidelines, if no sperm are seen in this sample then I can confidentially tell him that his ejaculate is free of sperm.          cc No primary care provider on file.      ***

## 2021-09-08 ENCOUNTER — Ambulatory Visit: Payer: BLUE CROSS/BLUE SHIELD

## 2021-09-14 ENCOUNTER — Ambulatory Visit: Payer: BLUE CROSS/BLUE SHIELD

## 2021-09-14 DIAGNOSIS — Z302 Encounter for sterilization: Secondary | ICD-10-CM

## 2021-09-14 MED ORDER — KETOROLAC TROMETHAMINE 15.75 MG/SPRAY NA SOLN
0 refills | Status: AC
Start: 2021-09-14 — End: ?

## 2021-10-08 ENCOUNTER — Telehealth: Payer: BLUE CROSS/BLUE SHIELD

## 2021-10-08 NOTE — Telephone Encounter
Verified online     Called to check insurance benefits and verify if authorization is required for CPT codes: [phone]    Procedure [phone] is covered at: 80%  Deductible applies? no  Copayment for visit: $0    Deductible met/total deductible: $0 / $0  OOP met/ total OOP: $128.28 / $8550        Auth required: no      Coverage Effective: 05/24/2021    Exclusions: na  _____________________________________    Procedure cost $1571.13-80% = $314.23 patient responsibility    Patient aware of pricing.

## 2021-10-20 NOTE — Progress Notes
PATIENT:  David Logan   MRN:  [id number]  DOB:  03/12/1956  DATE OF SERVICE:  10/23/2021  PRIMARY CARE PROVIDER: No primary care provider on file.    CHIEF COMPLAINT:   Desires vasectomy    I had the privilege of seeing the patient for vasectomy in my [facility] practice today.      HISTORY OF PRESENT ILLNESS     David Logan is a 66 y.o. male who follows up for vasectomy today.  He's had no changes in his health since his pre-procedure visit.  He has followed the pre-procedure instructions. He still wishes to proceed with the vasectomy today.    ALLERGIES     Allergies   Allergen Reactions   ? Cat Hair Extract Unknown (Patient does not know)   ? Pollen Extract Unknown (Patient does not know)        SOCIAL HISTORY     Social History     Socioeconomic History   ? Marital status: Married   Tobacco Use   ? Smoking status: Never     Passive exposure: Never   ? Smokeless tobacco: Never        FAMILY HISTORY     No family history on file.     REVIEW OF SYSTEMS     Review of Systems: A detailed 14 point review of systems was ascertained today including: endocrine, integument, cardiovascular, pulmonary, GI, urinary, hematologic, neurologic/psychiatric. All systems negative except as documented above.  Pertinent positives include: none unless otherwise listed in HPI.    PHYSICAL EXAM     Patient declined chaperone.       General:  The patient is a well-developed and well nourished male in no acute distress with normal attention to grooming. The patient is awake, alert and cooperative.    Extremities: Upper with no deformities. Lower - no edema.    Neurologic: The patient is oriented to person, place and time, and has normal mood and affect. The patient is dressed appropriately for the weather.      VITALS:   Temp Readings from Last 3 Encounters:   No data found for Temp      BP Readings from Last 3 Encounters:   09/14/21 137/79     Resp Readings from Last 3 Encounters:   No data found for Resp     PF Readings from Last 3 Encounters:   No data found for PF         PROCEDURE     Patient presents for vasectomy today.  He has 1 child.  He understands risks of procedure, permanent nature of procedure with potential of reversal.  His genitals were prepped, draped using sterile technique.  His right spermatic cord was grasped, vas deferens identified and instilled with 5 cc 2% lidocaine.  Puncture incision was made with 15 blade scalpel directly over the vas deferens in the high scrotum.  Supersharp Jacobsen forceps dissected perivasal tissue to isolate the vas deferens which was then elevated out of the sheath.  Straight clamps placed on testicular and abdominal vas 1 cm apart.  1 cm segment was then removed and small titanium clips were placed on the testicular and abdominal end of the vas.  Pinpoint cautery was used on both ends of the vas to further scar the vas down.  Testicular end of the vas was then dunked into a separate plane of Darto's fascia with Darto's cauterized.  The abdominal end was then dunked into a separate  plane.  1 horizontal mattress suture of 3-0 chromic was applied to scrotal skin.  Attention was turned to the left vas deferens where the exact same procedure was performed through a left puncture incision.  Sterile dressing of bacitracin, gauze fluffs was applied.  He tolerated procedure without apparent complication.    LABORATORY DATA     No results found for: ''HCT'', ''HGB'', ''MCV'', ''PLT'', ''WBC''    No results found for: ''NA''  No results found for: ''K''  No results found for: ''CL''  No results found for: ''CO2''  No results found for: ''BUN''  No results found for: ''CREAT''  No results found for: ''GLUCOSE''    IMPRESSION     Vasectomy    RECOMMENDATION     Vasectomy today performed without apparent complication.  Patient will continue icing scrotum for next 24-48 hours, refrain from heavy activity for 7 days, intercourse for at least 3 days.  He expressed understanding that he is still considered fertile until he returns for post-vasectomy semen analysis that shows no sperm. He can bathe and shower today.  He will call if he has scrotal swelling greater than a golf ball, bleeding from incision that doesn't stop with pressure application.    We provided him with a prescription of  ketorolac nasal spray (SPRIX) to use as need for post-vasectomy pain.    Attending LVN/MA assisting in procedure: Garey Ham    cc No primary care provider on file.      SCRIBE SIGNATURE(S):  I, Edom Schmuhl, am scribing for, and in the presence of Dr. Ronette Deter.     PHYSICIAN SIGNATURE(S):  I, Dr. Ronette Deter, personally performed the services described in this documentation, as scribed by Dorothy Spark in my presence, and it is both accurate and complete.

## 2021-10-23 ENCOUNTER — Ambulatory Visit: Payer: BLUE CROSS/BLUE SHIELD

## 2021-10-23 DIAGNOSIS — Z302 Encounter for sterilization: Secondary | ICD-10-CM

## 2021-10-29 ENCOUNTER — Ambulatory Visit: Payer: BLUE CROSS/BLUE SHIELD

## 2021-10-29 ENCOUNTER — Telehealth: Payer: BLUE CROSS/BLUE SHIELD

## 2021-10-29 NOTE — Telephone Encounter
PDL Call to Clinic    Reason for Call:pt is calling regarding his vsty procedure and wanting to know the next steps     Appointment Related?  []  Yes  [x]  No     If yes;  Date:  Time:    Call warm transferred to PDL: [x]  Yes  []  No    Call Received by Clinic Representative:Nena    If call not answered/not accepted, call received by Patient Services Representative:

## 2021-10-29 NOTE — Telephone Encounter
Pt is concerned after his vasectomy says that bruising didn't start until a few days later and it's only on one side. Pt did say he has done a lot of walking and feels that could have contributed to the bruising. Pt would like to know if theres cause for concern and when he can start running again?

## 2022-01-18 ENCOUNTER — Encounter: Payer: BC Managed Care – PPO | Admitting: Nurse Practitioner

## 2022-01-31 NOTE — Patient Instructions (Signed)

## 2022-02-02 ENCOUNTER — Ambulatory Visit (INDEPENDENT_AMBULATORY_CARE_PROVIDER_SITE_OTHER): Payer: BC Managed Care – PPO | Admitting: Nurse Practitioner

## 2022-02-02 ENCOUNTER — Encounter: Payer: Self-pay | Admitting: Nurse Practitioner

## 2022-02-02 VITALS — BP 112/72 | HR 73 | Temp 97.9°F | Ht 66.0 in | Wt 188.7 lb

## 2022-02-02 DIAGNOSIS — Z1211 Encounter for screening for malignant neoplasm of colon: Secondary | ICD-10-CM

## 2022-02-02 DIAGNOSIS — R7301 Impaired fasting glucose: Secondary | ICD-10-CM | POA: Diagnosis not present

## 2022-02-02 DIAGNOSIS — N400 Enlarged prostate without lower urinary tract symptoms: Secondary | ICD-10-CM

## 2022-02-02 DIAGNOSIS — E7800 Pure hypercholesterolemia, unspecified: Secondary | ICD-10-CM | POA: Diagnosis not present

## 2022-02-02 DIAGNOSIS — D696 Thrombocytopenia, unspecified: Secondary | ICD-10-CM

## 2022-02-02 DIAGNOSIS — M5442 Lumbago with sciatica, left side: Secondary | ICD-10-CM

## 2022-02-02 DIAGNOSIS — Z23 Encounter for immunization: Secondary | ICD-10-CM | POA: Diagnosis not present

## 2022-02-02 DIAGNOSIS — Z0000 Encounter for general adult medical examination without abnormal findings: Secondary | ICD-10-CM

## 2022-02-02 DIAGNOSIS — E66811 Obesity, class 1: Secondary | ICD-10-CM

## 2022-02-02 DIAGNOSIS — I10 Essential (primary) hypertension: Secondary | ICD-10-CM | POA: Diagnosis not present

## 2022-02-02 DIAGNOSIS — E669 Obesity, unspecified: Secondary | ICD-10-CM

## 2022-02-02 DIAGNOSIS — R808 Other proteinuria: Secondary | ICD-10-CM

## 2022-02-02 DIAGNOSIS — G8929 Other chronic pain: Secondary | ICD-10-CM

## 2022-02-02 MED ORDER — ATORVASTATIN CALCIUM 20 MG PO TABS
20.0000 mg | ORAL_TABLET | Freq: Every day | ORAL | 4 refills | Status: DC
Start: 2022-02-02 — End: 2023-02-08

## 2022-02-02 MED ORDER — LISINOPRIL 2.5 MG PO TABS: 2.5000 mg | ORAL_TABLET | Freq: Every day | ORAL | 4 refills | Status: DC

## 2022-02-02 NOTE — Assessment & Plan Note (Signed)
Chronic, ongoing.  Continue current medication regimen and adjust as needed.  Continue heavy focus on diet.  Obtain lipid panel today.  Refills sent.

## 2022-02-02 NOTE — Assessment & Plan Note (Signed)
A1c 5.7% last check, remaining stable with no increase -- recheck today.  Continue to monitor and focus on diet.  Could consider Metformin, which discussed with patient.

## 2022-02-02 NOTE — Assessment & Plan Note (Signed)
Ongoing on labs = 150 recent check.  Continue ACE for kidney protection and monitor closely.  Consider kidney ultrasound if persistent and possible referral to nephrology.

## 2022-02-02 NOTE — Assessment & Plan Note (Signed)
BMI 30.46.  Recommended eating smaller high protein, low fat meals more frequently and exercising 30 mins a day 5 times a week with a goal of 10-15lb weight loss in the next 3 months. Patient voiced their understanding and motivation to adhere to these recommendations.

## 2022-02-02 NOTE — Assessment & Plan Note (Signed)
Chronic, stable with BP well below goal in office and at home.  Continue current medication regimen and adjust as needed.  Recommend checking BP at home at least a few mornings a week and documenting.  DASH diet focus.  LABS: CBC, CMP, TSH, lipid.  Refills sent, continue Lisinopril for proteinuria (150 last check).  Return in 6 months.

## 2022-02-02 NOTE — Progress Notes (Signed)
BP 112/72   Pulse 73   Temp 97.9 F (36.6 C) (Oral)   Ht 5' 6"  (1.676 m)   Wt 188 lb 11.2 oz (85.6 kg)   SpO2 98%   BMI 30.46 kg/m    Subjective:    Patient ID: Colton Brown, male    DOB: [birth year], 66 y.o.   MRN: [id number]  HPI: Colton Brown is a 66 y.o. male presenting on 02/02/2022 for comprehensive medical examination. Current medical complaints include:none  He currently lives with: none Interim Problems from his last visit: no  HYPERTENSION / HYPERLIPIDEMIA Continues on Atorvastatin and Lisinopril +ASA every other day -- reduced due to low platelet count recent checks. Satisfied with current treatment? yes Duration of hypertension: chronic BP monitoring frequency: monthly BP range: 100-110/60-70 range HR 60-70 range BP medication side effects: no Duration of hyperlipidemia: chronic Cholesterol medication side effects: no Cholesterol supplements: none Medication compliance: good compliance Aspirin: yes Recent stressors: no Recurrent headaches: no Visual changes: no Palpitations: no Dyspnea: no Chest pain: no Lower extremity edema: no Dizzy/lightheaded: no  The 10-year ASCVD risk score (Arnett DK, et al., 2019) is: 9.9%   Values used to calculate the score:     Age: 38 years     Sex: Male     Is Non-Hispanic African American: No     Diabetic: No     Tobacco smoker: No     Systolic Blood Pressure: 409 mmHg     Is BP treated: Yes     HDL Cholesterol: 46 mg/dL     Total Cholesterol: 162 mg/dL   BACK PAIN Has had ongoing sciatic pain for 2 months, down the left side.  Extends all the way to ankle.  Stretching and placing ice packs.  Has history of surgery to L4-L5 due to herniation in the 90's.   Duration: months Mechanism of injury: no trauma Location: Left and low back Onset: gradual Severity: 8/10 Quality: dull, aching, and burning Frequency: constant Radiation: L leg below the knee Aggravating factors: lifting, movement, laying, bending, and prolonged  sitting Alleviating factors:  stretches, ice, and APAP Status: stable Treatments attempted: stretches, ice, and APAP  Relief with NSAIDs?: No NSAIDs Taken Nighttime pain:  no Paresthesias / decreased sensation:  no Bowel / bladder incontinence:  no Fevers:  no Dysuria / urinary frequency:  no   Functional Status Survey: Is the patient deaf or have difficulty hearing?: No Does the patient have difficulty seeing, even when wearing glasses/contacts?: No Does the patient have difficulty concentrating, remembering, or making decisions?: No Does the patient have difficulty walking or climbing stairs?: No Does the patient have difficulty dressing or bathing?: No Does the patient have difficulty doing errands alone such as visiting a doctor's office or shopping?: No  FALL RISK:    02/02/2022    8:19 AM 01/21/2021    8:18 AM 01/10/2020    8:09 AM 10/06/2018    1:00 PM 09/16/2017    1:01 PM  [place] in the past year? 0 0 0 0 No  Number falls in past yr: 0 0 0 0   Injury with Fall? 0 0 0 0   Risk for fall due to : No Fall Risks No Fall Risks No Fall Risks    Follow up Falls evaluation completed Education provided Falls evaluation completed      Depression Screen    02/02/2022    8:19 AM 07/21/2021    8:22 AM 01/21/2021  8:09 AM 01/10/2020    8:09 AM 10/06/2018   12:59 PM  Depression screen PHQ 2/9  Decreased Interest 0 0 0 0 0  Down, Depressed, Hopeless 0 0 0 0 0  PHQ - 2 Score 0 0 0 0 0  Altered sleeping 3 0   3  Tired, decreased energy 0 0   0  Change in appetite 0 0   0  Feeling bad or failure about yourself  0 0   0  Trouble concentrating 0 0   0  Moving slowly or fidgety/restless 3 0   0  Suicidal thoughts 0 0   0  PHQ-9 Score 6 0   3  Difficult doing work/chores Somewhat difficult Not difficult at all      Past Medical History:  Past Medical History:  Diagnosis Date   Chronic kidney disease    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   GERD (gastroesophageal reflux disease)     DUE TO GALLBLADDER-NO MEDS   Hyperlipidemia    Lumbago    Psoriasis     Surgical History:  Past Surgical History:  Procedure Laterality Date   ANKLE SURGERY Left [place] N/A 11/11/2015   Procedure: LAPAROSCOPIC CHOLECYSTECTOMY WITH INTRAOPERATIVE CHOLANGIOGRAM;  Surgeon: Hubbard Robinson, MD;  Location: [facility] ORS;  Service: General;  Laterality: N/A;   COLONOSCOPY WITH PROPOFOL N/A 11/02/2016   Procedure: COLONOSCOPY WITH PROPOFOL;  Surgeon: Lucilla Lame, MD;  Location: [facility] ENDOSCOPY;  Service: Endoscopy;  Laterality: N/A;   LUMBAR DISC SURGERY     L4-L5   WISDOM TOOTH EXTRACTION      Medications:  Current Outpatient Medications on File Prior to Visit  Medication Sig   aspirin 81 MG tablet Take 81 mg by mouth daily.   Turmeric 500 MG CAPS Take 1 capsule by mouth daily.   No current facility-administered medications on file prior to visit.    Allergies:  No Known Allergies  Social History:  Social History   Socioeconomic History   Marital status: Divorced    Spouse name: Not on file   Number of children: Not on file   Years of education: Not on file   Highest education level: Not on file  Occupational History   Not on file  Tobacco Use   Smoking status: Never   Smokeless tobacco: Never  Vaping Use   Vaping Use: Never used  Substance and Sexual Activity   Alcohol use: No    Alcohol/week: 0.0 standard drinks of alcohol   Drug use: No   Sexual activity: Yes  Other Topics Concern   Not on file  Social History Narrative   Not on file   Social Determinants of Health   Financial Resource Strain: Not on file  Food Insecurity: Not on file  Transportation Needs: Not on file  Physical Activity: Not on file  Stress: Not on file  Social Connections: Not on file  Intimate Partner Violence: Not on file   Social History   Tobacco Use  Smoking Status Never  Smokeless Tobacco Never   Social History   Substance and Sexual  Activity  Alcohol Use No   Alcohol/week: 0.0 standard drinks of alcohol   Family History:  Family History  Problem Relation Age of Onset   Asthma Mother    Stroke Mother    Migraines Mother    Thyroid disease Mother    Diabetes Mother    Heart disease Father  Hyperlipidemia Father    Hypertension Father    Stroke Father    Hyperlipidemia Brother    Hypertension Brother    Diabetes Maternal Grandmother    Diabetes Maternal Grandfather    Heart disease Paternal Grandmother     Past medical history, surgical history, medications, allergies, family history and social history reviewed with patient today and changes made to appropriate areas of the chart.   Review of Systems - negative All other ROS negative except what is listed above and in the HPI.      Objective:    BP 112/72   Pulse 73   Temp 97.9 F (36.6 C) (Oral)   Ht 5' 6"  (1.676 m)   Wt 188 lb 11.2 oz (85.6 kg)   SpO2 98%   BMI 30.46 kg/m   Wt Readings from Last 3 Encounters:  02/02/22 188 lb 11.2 oz (85.6 kg)  07/21/21 190 lb 6.4 oz (86.4 kg)  01/21/21 192 lb 3.2 oz (87.2 kg)    Physical Exam Vitals and nursing note reviewed.  Constitutional:      General: He is awake. He is not in acute distress.    Appearance: He is well-developed and well-groomed. He is not ill-appearing.  HENT:     Head: Normocephalic and atraumatic.     Right Ear: Hearing, tympanic membrane, ear canal and external ear normal. No drainage.     Left Ear: Hearing, tympanic membrane, ear canal and external ear normal. No drainage.     Nose: Nose normal.     Mouth/Throat:     Pharynx: Uvula midline.  Eyes:     General: Lids are normal.        Right eye: No discharge.        Left eye: No discharge.     Extraocular Movements: Extraocular movements intact.     Conjunctiva/sclera: Conjunctivae normal.     Pupils: Pupils are equal, round, and reactive to light.     Visual Fields: Right eye visual fields normal and left eye visual  fields normal.  Neck:     Thyroid: No thyromegaly.     Vascular: No carotid bruit or JVD.     Trachea: Trachea normal.  Cardiovascular:     Rate and Rhythm: Normal rate and regular rhythm.     Heart sounds: Normal heart sounds, S1 normal and S2 normal. No murmur heard.    No gallop.  Pulmonary:     Effort: Pulmonary effort is normal. No accessory muscle usage or respiratory distress.     Breath sounds: Normal breath sounds.  Abdominal:     General: Bowel sounds are normal.     Palpations: Abdomen is soft. There is no hepatomegaly or splenomegaly.     Tenderness: There is no abdominal tenderness.  Musculoskeletal:     Cervical back: Normal range of motion and neck supple.     Lumbar back: Tenderness (left lower) present. No swelling or spasms. Normal range of motion. Positive left straight leg raise test. Negative right straight leg raise test.     Right lower leg: No edema.     Left lower leg: No edema.  Lymphadenopathy:     Head:     Right side of head: No submental, submandibular, tonsillar, preauricular or posterior auricular adenopathy.     Left side of head: No submental, submandibular, tonsillar, preauricular or posterior auricular adenopathy.     Cervical: No cervical adenopathy.  Skin:    General: Skin is warm and dry.  Capillary Refill: Capillary refill takes less than 2 seconds.     Findings: No rash.  Neurological:     Mental Status: He is alert and oriented to person, place, and time.     Gait: Gait is intact.     Deep Tendon Reflexes: Reflexes are normal and symmetric.     Reflex Scores:      Brachioradialis reflexes are 2+ on the right side and 2+ on the left side.      Patellar reflexes are 2+ on the right side and 2+ on the left side. Psychiatric:        Attention and Perception: Attention normal.        Mood and Affect: Mood normal.        Speech: Speech normal.        Behavior: Behavior normal. Behavior is cooperative.        Thought Content: Thought  content normal.        Cognition and Memory: Cognition normal.        Judgment: Judgment normal.    Results for orders placed or performed in visit on 07/21/21  Bayer DCA Hb A1c Waived  Result Value Ref Range   HB A1C (BAYER DCA - WAIVED) 5.7 (H) 4.8 - 5.6 %  Microalbumin, Urine Waived  Result Value Ref Range   Microalb, Ur Waived 150 (H) 0 - 19 mg/L   Creatinine, Urine Waived 100 10 - 300 mg/dL   Microalb/Creat Ratio >300 (H) <30 mg/g  Basic metabolic panel  Result Value Ref Range   Glucose 111 (H) 70 - 99 mg/dL   BUN 9 8 - 27 mg/dL   Creatinine, Ser 0.86 0.76 - 1.27 mg/dL   eGFR 97 >59 mL/min/1.73   BUN/Creatinine Ratio 10 10 - 24   Sodium 140 134 - 144 mmol/L   Potassium 4.2 3.5 - 5.2 mmol/L   Chloride 100 96 - 106 mmol/L   CO2 24 20 - 29 mmol/L   Calcium 9.6 8.6 - 10.2 mg/dL  Lipid Panel w/o Chol/HDL Ratio  Result Value Ref Range   Cholesterol, Total 162 100 - 199 mg/dL   Triglycerides 146 0 - 149 mg/dL   HDL 46 >39 mg/dL   VLDL Cholesterol Cal 26 5 - 40 mg/dL   LDL Chol Calc (NIH) 90 0 - 99 mg/dL  CBC with Differential/Platelet  Result Value Ref Range   WBC 6.2 3.4 - 10.8 x10E3/uL   RBC 5.61 4.14 - 5.80 x10E6/uL   Hemoglobin 16.6 13.0 - 17.7 g/dL   Hematocrit 48.7 37.5 - 51.0 %   MCV 87 79 - 97 fL   MCH 29.6 26.6 - 33.0 pg   MCHC 34.1 31.5 - 35.7 g/dL   RDW 13.3 11.6 - 15.4 %   Platelets 139 (L) 150 - 450 x10E3/uL   Neutrophils 64 Not Estab. %   Lymphs 23 Not Estab. %   Monocytes 10 Not Estab. %   Eos 2 Not Estab. %   Basos 1 Not Estab. %   Neutrophils Absolute 4.0 1.4 - 7.0 x10E3/uL   Lymphocytes Absolute 1.4 0.7 - 3.1 x10E3/uL   Monocytes Absolute 0.6 0.1 - 0.9 x10E3/uL   EOS (ABSOLUTE) 0.2 0.0 - 0.4 x10E3/uL   Basophils Absolute 0.1 0.0 - 0.2 x10E3/uL   Immature Granulocytes 0 Not Estab. %   Immature Grans (Abs) 0.0 0.0 - 0.1 x10E3/uL   Hematology Comments: Note:       Assessment & Plan:   Problem List Items  Addressed This Visit        Cardiovascular and Mediastinum   Essential hypertension, benign    Chronic, stable with BP well below goal in office and at home.  Continue current medication regimen and adjust as needed.  Recommend checking BP at home at least a few mornings a week and documenting.  DASH diet focus.  LABS: CBC, CMP, TSH, lipid.  Refills sent, continue Lisinopril for proteinuria (150 last check).  Return in 6 months.         Relevant Medications   atorvastatin (LIPITOR) 20 MG tablet   lisinopril (ZESTRIL) 2.5 MG tablet   Other Relevant Orders   CBC with Differential/Platelet   TSH     Endocrine   Impaired fasting blood sugar    A1c 5.7% last check, remaining stable with no increase -- recheck today.  Continue to monitor and focus on diet.  Could consider Metformin, which discussed with patient.      Relevant Orders   HgB A1c     Nervous and Auditory   Chronic low back pain with left-sided sciatica    Chronic with current acute flare, history of back surgery in the 90's.  At this time wishes to defer medications of any kind. Recommend he obtain a massage due to tightness lower left side on palpation and continue stretches at home.  Tylenol as needed and heat.  Consider Voltaren gel and Icy Hot as needed.  He will return to chiropractor as well.  Return if ongoing or worsening and will consider imaging at that point.        Hematopoietic and Hemostatic   Thrombocytopenia ([place]) - Primary    Noted intermittently on past labs, has reduced Baby ASA to every other day. Level often in 140 range when low.  Recheck CBC today and monitor closely.      Relevant Orders   CBC with Differential/Platelet     Other   Hyperlipidemia    Chronic, ongoing.  Continue current medication regimen and adjust as needed.  Continue heavy focus on diet.  Obtain lipid panel today.  Refills sent.      Relevant Medications   atorvastatin (LIPITOR) 20 MG tablet   lisinopril (ZESTRIL) 2.5 MG tablet   Other Relevant Orders    Comprehensive metabolic panel   Lipid Panel w/o Chol/HDL Ratio   Obesity (BMI 30.0-34.9)    BMI 30.46.  Recommended eating smaller high protein, low fat meals more frequently and exercising 30 mins a day 5 times a week with a goal of 10-15lb weight loss in the next 3 months. Patient voiced their understanding and motivation to adhere to these recommendations.       Proteinuria    Ongoing on labs = 150 recent check.  Continue ACE for kidney protection and monitor closely.  Consider kidney ultrasound if persistent and possible referral to nephrology.      Other Visit Diagnoses     Benign prostatic hyperplasia without lower urinary tract symptoms       PSA on labs today   Relevant Orders   PSA   Colon cancer screening       GI referral placed.   Relevant Orders   Ambulatory referral to Gastroenterology   Flu vaccine need       Flu vaccine provided today.   Relevant Orders   Flu Vaccine QUAD 6+ mos PF IM (Fluarix Quad PF)   Encounter for annual physical exam       Annual physical today  with labs and health maintenance reviewed, discussed with patient.      Discussed aspirin prophylaxis for myocardial infarction prevention and decision was made to continue ASA on every other day basis due to lower end platelets.  LABORATORY TESTING:  Health maintenance labs ordered today as discussed above.   The natural history of prostate cancer and ongoing controversy regarding screening and potential treatment outcomes of prostate cancer has been discussed with the patient. The meaning of a false positive PSA and a false negative PSA has been discussed. He indicates understanding of the limitations of this screening test and wishes to proceed with screening PSA testing.   IMMUNIZATIONS:   - Tdap: Tetanus vaccination status reviewed: last tetanus booster within 10 years. - Influenza: Provided today - Pneumovax: Not applicable - Prevnar: Not applicable - Zostavax vaccine: Up to  date  SCREENING: - Colonoscopy: Ordered placed today Discussed with patient purpose of the colonoscopy is to detect colon cancer at curable precancerous or early stages   - AAA Screening: Not applicable  -Hearing Test: Not applicable  -Spirometry: Not applicable   PATIENT COUNSELING:    Sexuality: Discussed sexually transmitted diseases, partner selection, use of condoms, avoidance of unintended pregnancy  and contraceptive alternatives.   Advised to avoid cigarette smoking.  I discussed with the patient that most people either abstain from alcohol or drink within safe limits (<=14/week and <=4 drinks/occasion for males, <=7/weeks and <= 3 drinks/occasion for females) and that the risk for alcohol disorders and other health effects rises proportionally with the number of drinks per week and how often a drinker exceeds daily limits.  Discussed cessation/primary prevention of drug use and availability of treatment for abuse.   Diet: Encouraged to adjust caloric intake to maintain  or achieve ideal body weight, to reduce intake of dietary saturated fat and total fat, to limit sodium intake by avoiding high sodium foods and not adding table salt, and to maintain adequate dietary potassium and calcium preferably from fresh fruits, vegetables, and low-fat dairy products.    Stressed the importance of regular exercise  Injury prevention: Discussed safety belts, safety helmets, smoke detector, smoking near bedding or upholstery.   Dental health: Discussed importance of regular tooth brushing, flossing, and dental visits.   Follow up plan: NEXT PREVENTATIVE PHYSICAL DUE IN 1 YEAR. Return in about 6 months (around 08/03/2022) for HTN/HLD.

## 2022-02-02 NOTE — Assessment & Plan Note (Signed)
Noted intermittently on past labs, has reduced Baby ASA to every other day. Level often in 140 range when low.  Recheck CBC today and monitor closely.

## 2022-02-02 NOTE — Assessment & Plan Note (Signed)
Chronic with current acute flare, history of back surgery in the 90's.  At this time wishes to defer medications of any kind. Recommend he obtain a massage due to tightness lower left side on palpation and continue stretches at home.  Tylenol as needed and heat.  Consider Voltaren gel and Icy Hot as needed.  He will return to chiropractor as well.  Return if ongoing or worsening and will consider imaging at that point.

## 2022-02-03 LAB — CBC WITH DIFFERENTIAL/PLATELET
Basophils Absolute: 0.1 10*3/uL (ref 0.0–0.2)
Basos: 1 %
EOS (ABSOLUTE): 0.2 10*3/uL (ref 0.0–0.4)
Eos: 3 %
Hematocrit: 49.8 % (ref 37.5–51.0)
Hemoglobin: 16.8 g/dL (ref 13.0–17.7)
Immature Grans (Abs): 0 10*3/uL (ref 0.0–0.1)
Immature Granulocytes: 0 %
Lymphocytes Absolute: 1.5 10*3/uL (ref 0.7–3.1)
Lymphs: 23 %
MCH: 29.5 pg (ref 26.6–33.0)
MCHC: 33.7 g/dL (ref 31.5–35.7)
MCV: 88 fL (ref 79–97)
Monocytes Absolute: 0.5 10*3/uL (ref 0.1–0.9)
Monocytes: 8 %
Neutrophils Absolute: 4.2 10*3/uL (ref 1.4–7.0)
Neutrophils: 65 %
Platelets: 148 10*3/uL — ABNORMAL LOW (ref 150–450)
RBC: 5.69 x10E6/uL (ref 4.14–5.80)
RDW: 13.1 % (ref 11.6–15.4)
WBC: 6.5 10*3/uL (ref 3.4–10.8)

## 2022-02-03 LAB — COMPREHENSIVE METABOLIC PANEL
ALT: 32 IU/L (ref 0–44)
AST: 19 IU/L (ref 0–40)
Albumin/Globulin Ratio: 2 (ref 1.2–2.2)
Albumin: 4.7 g/dL (ref 3.9–4.9)
Alkaline Phosphatase: 69 IU/L (ref 44–121)
BUN/Creatinine Ratio: 11 (ref 10–24)
BUN: 10 mg/dL (ref 8–27)
Bilirubin Total: 1.1 mg/dL (ref 0.0–1.2)
CO2: 24 mmol/L (ref 20–29)
Calcium: 9.8 mg/dL (ref 8.6–10.2)
Chloride: 100 mmol/L (ref 96–106)
Creatinine, Ser: 0.9 mg/dL (ref 0.76–1.27)
Globulin, Total: 2.4 g/dL (ref 1.5–4.5)
Glucose: 117 mg/dL — ABNORMAL HIGH (ref 70–99)
Potassium: 4.1 mmol/L (ref 3.5–5.2)
Sodium: 137 mmol/L (ref 134–144)
Total Protein: 7.1 g/dL (ref 6.0–8.5)
eGFR: 95 mL/min/{1.73_m2} (ref >59)

## 2022-02-03 LAB — HEMOGLOBIN A1C
Est. average glucose Bld gHb Est-mCnc: 134 mg/dL
Hgb A1c MFr Bld: 6.3 % — ABNORMAL HIGH (ref 4.8–5.6)

## 2022-02-03 LAB — LIPID PANEL W/O CHOL/HDL RATIO
Cholesterol, Total: 143 mg/dL (ref 100–199)
HDL: 53 mg/dL (ref >39)
LDL Chol Calc (NIH): 67 mg/dL (ref 0–99)
Triglycerides: 132 mg/dL (ref 0–149)
VLDL Cholesterol Cal: 23 mg/dL (ref 5–40)

## 2022-02-03 LAB — PSA: Prostate Specific Ag, Serum: 1.4 ng/mL (ref 0.0–4.0)

## 2022-02-03 LAB — TSH: TSH: 1.94 u[IU]/mL (ref 0.450–4.500)

## 2022-02-03 NOTE — Progress Notes (Signed)
Contacted via MyChart   Good evening Narciso, your labs have returned: - CBC continues to show lower platelets, but these have come up a little.:) - Kidney function, creatinine and eGFR, remains normal, as is liver function, AST and ALT.   - A1c, diabetes testing has trended up to 6.3% -- this is going towards that 6.5% of diabetes -- please focus heavily on diet changes and regular activity.  We will recheck next visit. - Remainder of labs look great.  Any questions? Keep being amazing!!  Thank you for allowing me to participate in your care.  I appreciate you. Kindest regards, Jatara Huettner

## 2022-02-08 ENCOUNTER — Telehealth: Payer: Self-pay

## 2022-02-08 NOTE — Telephone Encounter (Signed)
Patient calling back to schedule colonoscopy. Requesting call back.

## 2022-02-10 ENCOUNTER — Other Ambulatory Visit: Payer: Self-pay

## 2022-02-10 ENCOUNTER — Telehealth: Payer: Self-pay

## 2022-02-10 DIAGNOSIS — Z86010 Personal history of colonic polyps: Secondary | ICD-10-CM

## 2022-02-10 MED ORDER — NA SULFATE-K SULFATE-MG SULF 17.5-3.13-1.6 GM/177ML PO SOLN: 1.0000 | Freq: Once | ORAL | 0 refills | Status: AC

## 2022-02-10 NOTE — Telephone Encounter (Signed)
Gastroenterology Pre-Procedure Review  Request Date: 03/23/22 Requesting Physician: Dr. Allen Norris  PATIENT REVIEW QUESTIONS: The patient responded to the following health history questions as indicated:    1. Are you having any GI issues? no 2. Do you have a personal history of Polyps? Yes last colonoscopy 11/02/16 performed by Dr. Allen Norris 3. Do you have a family history of Colon Cancer or Polyps? no 4. Diabetes Mellitus? no 5. Joint replacements in the past 12 months?no 6. Major health problems in the past 3 months?no 7. Any artificial heart valves, MVP, or defibrillator?no    MEDICATIONS & ALLERGIES:    Patient reports the following regarding taking any anticoagulation/antiplatelet therapy:   Plavix, Coumadin, Eliquis, Xarelto, Lovenox, Pradaxa, Brilinta, or Effient? no Aspirin? no  Patient confirms/reports the following medications:  Current Outpatient Medications  Medication Sig Dispense Refill   aspirin 81 MG tablet Take 81 mg by mouth daily.     atorvastatin (LIPITOR) 20 MG tablet Take 1 tablet (20 mg total) by mouth daily. 90 tablet 4   lisinopril (ZESTRIL) 2.5 MG tablet Take 1 tablet (2.5 mg total) by mouth daily. 90 tablet 4   Turmeric 500 MG CAPS Take 1 capsule by mouth daily.     No current facility-administered medications for this visit.    Patient confirms/reports the following allergies:  No Known Allergies  No orders of the defined types were placed in this encounter.   AUTHORIZATION INFORMATION Primary Insurance: 1D#: Group #:  Secondary Insurance: 1D#: Group #:  SCHEDULE INFORMATION: Date: 03/23/22 Time: Location: [place]

## 2022-03-22 ENCOUNTER — Encounter: Payer: Self-pay | Admitting: Gastroenterology

## 2022-03-23 ENCOUNTER — Ambulatory Visit
Admission: RE | Admit: 2022-03-23 | Discharge: 2022-03-23 | Disposition: A | Discharge status: Home / Self Care | Payer: BC Managed Care – PPO | Attending: Gastroenterology | Admitting: Gastroenterology

## 2022-03-23 ENCOUNTER — Encounter
Admission: RE | Disposition: A | Discharge status: Home / Self Care | Payer: Self-pay | Source: Home / Self Care | Attending: Gastroenterology

## 2022-03-23 ENCOUNTER — Encounter: Payer: Self-pay | Admitting: Gastroenterology

## 2022-03-23 ENCOUNTER — Ambulatory Visit: Payer: BC Managed Care – PPO

## 2022-03-23 DIAGNOSIS — Z1211 Encounter for screening for malignant neoplasm of colon: Secondary | ICD-10-CM | POA: Diagnosis not present

## 2022-03-23 DIAGNOSIS — G709 Myoneural disorder, unspecified: Secondary | ICD-10-CM | POA: Diagnosis not present

## 2022-03-23 DIAGNOSIS — K219 Gastro-esophageal reflux disease without esophagitis: Secondary | ICD-10-CM | POA: Diagnosis not present

## 2022-03-23 DIAGNOSIS — N181 Chronic kidney disease, stage 1: Secondary | ICD-10-CM | POA: Diagnosis not present

## 2022-03-23 DIAGNOSIS — K5730 Diverticulosis of large intestine without perforation or abscess without bleeding: Secondary | ICD-10-CM | POA: Diagnosis not present

## 2022-03-23 DIAGNOSIS — Z860100 Personal history of colon polyps, unspecified: Secondary | ICD-10-CM

## 2022-03-23 DIAGNOSIS — D120 Benign neoplasm of cecum: Secondary | ICD-10-CM | POA: Insufficient documentation

## 2022-03-23 DIAGNOSIS — K635 Polyp of colon: Secondary | ICD-10-CM

## 2022-03-23 DIAGNOSIS — K640 First degree hemorrhoids: Secondary | ICD-10-CM | POA: Diagnosis not present

## 2022-03-23 DIAGNOSIS — I129 Hypertensive chronic kidney disease with stage 1 through stage 4 chronic kidney disease, or unspecified chronic kidney disease: Secondary | ICD-10-CM | POA: Diagnosis not present

## 2022-03-23 DIAGNOSIS — Z8249 Family history of ischemic heart disease and other diseases of the circulatory system: Secondary | ICD-10-CM | POA: Diagnosis not present

## 2022-03-23 HISTORY — PX: COLONOSCOPY WITH PROPOFOL: SHX5780

## 2022-03-23 SURGERY — COLONOSCOPY WITH PROPOFOL
Anesthesia: General

## 2022-03-23 MED ORDER — MIDAZOLAM HCL 5 MG/5ML IJ SOLN
INTRAMUSCULAR | Status: DC | PRN
  Administered 2022-03-23: 2 mg via INTRAVENOUS

## 2022-03-23 MED ORDER — PROPOFOL 10 MG/ML IV BOLUS
INTRAVENOUS | Status: DC | PRN
  Administered 2022-03-23: 30 mg via INTRAVENOUS
  Administered 2022-03-23: 20 mg via INTRAVENOUS
  Administered 2022-03-23: 50 mg via INTRAVENOUS
  Administered 2022-03-23: 30 mg via INTRAVENOUS

## 2022-03-23 MED ORDER — LIDOCAINE 2% (20 MG/ML) 5 ML SYRINGE
INTRAMUSCULAR | Status: DC | PRN
  Administered 2022-03-23: 20 mg via INTRAVENOUS

## 2022-03-23 MED ORDER — SODIUM CHLORIDE 0.9 % IV SOLN
INTRAVENOUS | Status: DC
  Administered 2022-03-23: 1000 mL via INTRAVENOUS

## 2022-03-23 MED ORDER — PROPOFOL 500 MG/50ML IV EMUL
INTRAVENOUS | Status: DC | PRN
  Administered 2022-03-23: 140 ug/kg/min via INTRAVENOUS

## 2022-03-23 MED ORDER — MIDAZOLAM HCL 2 MG/2ML IJ SOLN
INTRAMUSCULAR | Status: AC
  Filled 2022-03-23: qty 2

## 2022-03-23 NOTE — Anesthesia Postprocedure Evaluation (Signed)
Anesthesia Post Note  Patient: Colton Brown  Procedure(s) Performed: COLONOSCOPY WITH PROPOFOL  Patient location during evaluation: Endoscopy Anesthesia Type: General Level of consciousness: awake and alert Pain management: pain level controlled Vital Signs Assessment: post-procedure vital signs reviewed and stable Respiratory status: spontaneous breathing, nonlabored ventilation and respiratory function stable Cardiovascular status: blood pressure returned to baseline and stable Postop Assessment: no apparent nausea or vomiting Anesthetic complications: no   No notable events documented.   Last Vitals:  Vitals:   03/23/22 1137 03/23/22 1157  BP: 114/65 115/75  Pulse:    Resp:    Temp:    SpO2:      Last Pain:  Vitals:   03/23/22 1157  TempSrc:   PainSc: 0-No pain                 Alphonsus Sias

## 2022-03-23 NOTE — Op Note (Signed)
[facility] Patient Name: Col[facility] Procedure Date: 03/23/2022 11:10 AM MRN: [id number] Account #: [id number] Date of Birth: April 16, 1956 Admit Type: Outpatient Age: 66 Room: [facility] ENDO ROOM 4 Gender: Male Note Status: Finalized Instrument Name: Park Meo [id number] Procedure:             Colonoscopy Indications:           High risk colon cancer surveillance: Personal history                         of colonic polyps Providers:             Lucilla Lame MD, MD Referring MD:          Barbaraann Faster. Cannady (Referring MD) Medicines:             Propofol per Anesthesia Complications:         No immediate complications. Procedure:             Pre-Anesthesia Assessment:                        - Prior to the procedure, a History and Physical was                         performed, and patient medications and allergies were                         reviewed. The patient's tolerance of previous                         anesthesia was also reviewed. The risks and benefits                         of the procedure and the sedation options and risks                         were discussed with the patient. All questions were                         answered, and informed consent was obtained. Prior                         Anticoagulants: The patient has taken no anticoagulant                         or antiplatelet agents. ASA Grade Assessment: II - A                         patient with mild systemic disease. After reviewing                         the risks and benefits, the patient was deemed in                         satisfactory condition to undergo the procedure.                        After obtaining informed consent, the colonoscope was  passed under direct vision. Throughout the procedure,                         the patient's blood pressure, pulse, and oxygen                         saturations were monitored continuously. The                          Colonoscope was introduced through the anus and                         advanced to the the cecum, identified by appendiceal                         orifice and ileocecal valve. The colonoscopy was                         performed without difficulty. The patient tolerated                         the procedure well. The quality of the bowel                         preparation was good. Findings:      The perianal and digital rectal examinations were normal.      Two sessile polyps were found in the cecum. The polyps were 2 to 3 mm in       size. These polyps were removed with a cold biopsy forceps. Resection       and retrieval were complete.      Multiple small-mouthed diverticula were found in the sigmoid colon.      Non-bleeding internal hemorrhoids were found during retroflexion. The       hemorrhoids were Grade I (internal hemorrhoids that do not prolapse). Impression:            - Two 2 to 3 mm polyps in the cecum, removed with a                         cold biopsy forceps. Resected and retrieved.                        - Diverticulosis in the sigmoid colon.                        - Non-bleeding internal hemorrhoids. Recommendation:        - Discharge patient to home.                        - Resume previous diet.                        - Continue present medications.                        - Await pathology results.                        - Repeat colonoscopy in 7 years for surveillance. Procedure Code(s):     --- Professional ---  45380, Colonoscopy, flexible; with biopsy, single or                         multiple Diagnosis Code(s):     --- Professional ---                        Z86.010, Personal history of colonic polyps                        D12.0, Benign neoplasm of cecum CPT copyright 2022 American [employer] Association. All rights reserved. The codes documented in this report are preliminary and upon coder review may  be revised to meet  current compliance requirements. Lucilla Lame MD, MD 03/23/2022 11:39:13 AM This report has been signed electronically. Number of Addenda: 0 Note Initiated On: 03/23/2022 11:10 AM Scope Withdrawal Time: 0 hours 5 minutes 54 seconds  Total Procedure Duration: 0 hours 10 minutes 14 seconds  Estimated Blood Loss:  Estimated blood loss: none.      [facility]

## 2022-03-23 NOTE — Anesthesia Preprocedure Evaluation (Signed)
Anesthesia Evaluation  Patient identified by MRN, date of birth, ID band Patient awake    Reviewed: Allergy & Precautions, NPO status , Patient's Chart, lab work & pertinent test results  Airway Mallampati: III  TM Distance: >3 FB Neck ROM: full    Dental  (+) Chipped   Pulmonary neg pulmonary ROS,    Pulmonary exam normal        Cardiovascular hypertension, Normal cardiovascular exam     Neuro/Psych  Neuromuscular disease negative psych ROS   GI/Hepatic Neg liver ROS, GERD  ,  Endo/Other  negative endocrine ROS  Renal/GU Renal disease  negative genitourinary   Musculoskeletal   Abdominal   Peds  Hematology negative hematology ROS (+)   Anesthesia Other Findings Past Medical History: No date: Chronic kidney disease     Comment:  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 No date: GERD (gastroesophageal reflux disease)     Comment:  DUE TO GALLBLADDER-NO MEDS No date: Hyperlipidemia No date: Lumbago No date: Psoriasis  Past Surgical History: 05/25/1987: ANKLE SURGERY; Left 05/24/1977: APPENDECTOMY No date: BACK SURGERY 11/11/2015: CHOLECYSTECTOMY; N/A     Comment:  Procedure: LAPAROSCOPIC CHOLECYSTECTOMY WITH               INTRAOPERATIVE CHOLANGIOGRAM;  Surgeon: Hubbard Robinson, MD;  Location: [facility] ORS;  Service: General;                Laterality: N/A; 11/02/2016: COLONOSCOPY WITH PROPOFOL; N/A     Comment:  Procedure: COLONOSCOPY WITH PROPOFOL;  Surgeon: Lucilla Lame, MD;  Location: [facility] ENDOSCOPY;  Service:               Endoscopy;  Laterality: N/A; No date: LUMBAR DISC SURGERY     Comment:  L4-L5 No date: WISDOM TOOTH EXTRACTION  BMI    Body Mass Index: 28.99 kg/m      Reproductive/Obstetrics negative OB ROS                             Anesthesia Physical Anesthesia Plan  ASA: 2  Anesthesia Plan: General   Post-op Pain Management:    Induction:  Intravenous  PONV Risk Score and Plan: Propofol infusion and TIVA  Airway Management Planned: Natural Airway and Nasal Cannula  Additional Equipment:   Intra-op Plan:   Post-operative Plan:   Informed Consent: I have reviewed the patients History and Physical, chart, labs and discussed the procedure including the risks, benefits and alternatives for the proposed anesthesia with the patient or authorized representative who has indicated his/her understanding and acceptance.     Dental Advisory Given  Plan Discussed with: Anesthesiologist, CRNA and Surgeon  Anesthesia Plan Comments: (Patient consented for risks of anesthesia including but not limited to:  - adverse reactions to medications - risk of airway placement if required - damage to eyes, teeth, lips or other oral mucosa - nerve damage due to positioning  - sore throat or hoarseness - Damage to heart, brain, nerves, lungs, other parts of body or loss of life  Patient voiced understanding.)        Anesthesia Quick Evaluation

## 2022-03-23 NOTE — Transfer of Care (Signed)
Immediate Anesthesia Transfer of Care Note  Patient: [facility]  Procedure(s) Performed: COLONOSCOPY WITH PROPOFOL  Patient Location: PACU and Endoscopy Unit  Anesthesia Type:MAC and General  Level of Consciousness: awake and drowsy  Airway & Oxygen Therapy: Patient Spontanous Breathing  Post-op Assessment: Report given to RN  Post vital signs: Reviewed  Last Vitals:  Vitals Value Taken Time  BP 114/65 03/23/22 1137  Temp    Pulse 87 03/23/22 1138  Resp 17 03/23/22 1138  SpO2 93 % 03/23/22 1138  Vitals shown include unvalidated device data.  Last Pain:  Vitals:   03/23/22 1022  TempSrc: Temporal  PainSc: 0-No pain         Complications: No notable events documented.

## 2022-03-23 NOTE — H&P (Signed)
Lucilla Lame, MD [facility] [address], [place], [place] [address] Phone:[phone] Fax : [phone]  Primary Care Physician:  Venita Lick, NP Primary Gastroenterologist:  Dr. Allen Norris  Pre-Procedure History & Physical: HPI:  Colton Brown is a 66 y.o. male is here for an colonoscopy.   Past Medical History:  Diagnosis Date   Chronic kidney disease    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   GERD (gastroesophageal reflux disease)    DUE TO GALLBLADDER-NO MEDS   Hyperlipidemia    Lumbago    Psoriasis     Past Surgical History:  Procedure Laterality Date   ANKLE SURGERY Left 05/25/1987   APPENDECTOMY  05/24/1977   BACK SURGERY     CHOLECYSTECTOMY N/A 11/11/2015   Procedure: LAPAROSCOPIC CHOLECYSTECTOMY WITH INTRAOPERATIVE CHOLANGIOGRAM;  Surgeon: Hubbard Robinson, MD;  Location: [facility] ORS;  Service: General;  Laterality: N/A;   COLONOSCOPY WITH PROPOFOL N/A 11/02/2016   Procedure: COLONOSCOPY WITH PROPOFOL;  Surgeon: Lucilla Lame, MD;  Location: [facility] ENDOSCOPY;  Service: Endoscopy;  Laterality: N/A;   LUMBAR DISC SURGERY     L4-L5   WISDOM TOOTH EXTRACTION      Prior to Admission medications   Medication Sig Start Date End Date Taking? Authorizing Provider  aspirin 81 MG tablet Take 81 mg by mouth daily.   Yes [provider]  atorvastatin (LIPITOR) 20 MG tablet Take 1 tablet (20 mg total) by mouth daily. 02/02/22  Yes Cannady, Jolene T, NP  lisinopril (ZESTRIL) 2.5 MG tablet Take 1 tablet (2.5 mg total) by mouth daily. 02/02/22  Yes Cannady, Jolene T, NP  Turmeric 500 MG CAPS Take 1 capsule by mouth daily.   Yes [provider]    Allergies as of 02/10/2022   (No Known Allergies)    Family History  Problem Relation Age of Onset   Asthma Mother    Stroke Mother    Migraines Mother    Thyroid disease Mother    Diabetes Mother    Heart disease Father    Hyperlipidemia Father    Hypertension Father    Stroke Father    Hyperlipidemia Brother     Hypertension Brother    Diabetes Maternal Grandmother    Diabetes Maternal Grandfather    Heart disease Paternal Grandmother     Social History   Socioeconomic History   Marital status: Divorced    Spouse name: Not on file   Number of children: Not on file   Years of education: Not on file   Highest education level: Not on file  Occupational History   Not on file  Tobacco Use   Smoking status: Never   Smokeless tobacco: Never  Vaping Use   Vaping Use: Never used  Substance and Sexual Activity   Alcohol use: No    Alcohol/week: 0.0 standard drinks of alcohol   Drug use: No   Sexual activity: Yes  Other Topics Concern   Not on file  Social History Narrative   Not on file   Social Determinants of Health   Financial Resource Strain: Not on file  Food Insecurity: Not on file  Transportation Needs: Not on file  Physical Activity: Not on file  Stress: Not on file  Social Connections: Not on file  Intimate Partner Violence: Not on file    Review of Systems: See HPI, otherwise negative ROS  Physical Exam: BP 133/79   Pulse 85   Temp (!) 97.2 F (36.2 C) (Temporal)   Resp 18  Ht 5' 6.5" (1.689 m)   Wt 82.7 kg   SpO2 100%   BMI 28.99 kg/m  General:   Alert,  pleasant and cooperative in NAD Head:  Normocephalic and atraumatic. Neck:  Supple; no masses or thyromegaly. Lungs:  Clear throughout to auscultation.    Heart:  Regular rate and rhythm. Abdomen:  Soft, nontender and nondistended. Normal bowel sounds, without guarding, and without rebound.   Neurologic:  Alert and  oriented x4;  grossly normal neurologically.  Impression/Plan: [facility] is here for an colonoscopy to be performed for a history of adenomatous polyps on 2018   Risks, benefits, limitations, and alternatives regarding  colonoscopy have been reviewed with the patient.  Questions have been answered.  All parties agreeable.   Lucilla Lame, MD  03/23/2022, 10:45 AM

## 2022-03-24 ENCOUNTER — Encounter: Payer: Self-pay | Admitting: Gastroenterology

## 2022-03-24 LAB — SURGICAL PATHOLOGY

## 2022-03-25 ENCOUNTER — Encounter: Payer: Self-pay | Admitting: Gastroenterology

## 2022-07-31 NOTE — Patient Instructions (Signed)

## 2022-08-03 ENCOUNTER — Ambulatory Visit: Payer: Medicare PPO | Admitting: Nurse Practitioner

## 2022-08-03 ENCOUNTER — Encounter: Payer: Self-pay | Admitting: Nurse Practitioner

## 2022-08-03 VITALS — BP 120/76 | HR 67 | Temp 97.7°F | Ht 64.41 in | Wt 183.9 lb

## 2022-08-03 DIAGNOSIS — Z23 Encounter for immunization: Secondary | ICD-10-CM | POA: Diagnosis not present

## 2022-08-03 DIAGNOSIS — I10 Essential (primary) hypertension: Secondary | ICD-10-CM | POA: Diagnosis not present

## 2022-08-03 DIAGNOSIS — R808 Other proteinuria: Secondary | ICD-10-CM

## 2022-08-03 DIAGNOSIS — R7301 Impaired fasting glucose: Secondary | ICD-10-CM | POA: Diagnosis not present

## 2022-08-03 DIAGNOSIS — E669 Obesity, unspecified: Secondary | ICD-10-CM

## 2022-08-03 DIAGNOSIS — D696 Thrombocytopenia, unspecified: Secondary | ICD-10-CM | POA: Diagnosis not present

## 2022-08-03 DIAGNOSIS — E7800 Pure hypercholesterolemia, unspecified: Secondary | ICD-10-CM

## 2022-08-03 LAB — BAYER DCA HB A1C WAIVED: HB A1C (BAYER DCA - WAIVED): 5.7 % — ABNORMAL HIGH (ref 4.8–5.6)

## 2022-08-03 LAB — MICROALBUMIN, URINE WAIVED
Creatinine, Urine Waived: 50 mg/dL (ref 10–300)
Microalb, Ur Waived: 80 mg/L — ABNORMAL HIGH (ref 0–19)
Microalb/Creat Ratio: >300 mg/g — ABNORMAL HIGH (ref <30)

## 2022-08-03 NOTE — Assessment & Plan Note (Signed)
Pt reports losing about 20 lbs with diet and exercise. BMI 31.17 today. Continued focus on diet and exercise with healthy goal weight for BMI less than 25. Patient voiced motivation to these recommendations.

## 2022-08-03 NOTE — Assessment & Plan Note (Signed)
A1c down to 5.7 today. Continue current medication regimen and adjust as needed. Continue to monitor and focus on diet.

## 2022-08-03 NOTE — Progress Notes (Signed)
BP 120/76   Pulse 67   Temp 97.7 F (36.5 C) (Oral)   Ht 5' 4.41" (1.636 m)   Wt 183 lb 14.4 oz (83.4 kg)   SpO2 98%   BMI 31.17 kg/m    Subjective:    Patient ID: [facility], male    DOB: 10-28-1955, 67 y.o.   MRN: [id number]  HPI: [facility] is a 67 y.o. male  NOTE WRITTEN BY DNP STUDENT.  ASSESSMENT AND PLAN OF CARE REVIEWED WITH STUDENT, AGREE WITH ABOVE FINDINGS AND PLAN.   Chief Complaint  Patient presents with   Hyperlipidemia   Hypertension   HYPERTENSION / HYPERLIPIDEMIA Continues on Lisinopril and Atorvastatin.  Reports he has lost about 20 lbs and is watching his diet more. States he is staying away from fried foods and is hopeful his A1c will be lower this visit. Is hoping to get a more routine exercise regimen in place. Discussed Welcome to Medicare appointment for next visit. Satisfied with current treatment? no Duration of hypertension: years BP monitoring frequency: rarely BP range:  BP medication side effects: no Duration of hyperlipidemia: years Cholesterol medication side effects: no Cholesterol supplements: none, fish oil, niacin, and red yeast rice Medication compliance: excellent compliance Aspirin: yes Recent stressors:  Recurrent headaches: no Visual changes: nono Palpitations: no Dyspnea: no Chest pain: no Lower extremity edema: no Dizzy/lightheaded: no   PREDIABETES September A1c 6.3%, continues focus on diet. Polydipsia/polyuria: no Visual disturbance: no Chest pain: no Paresthesias: no  Relevant past medical, surgical, family and social history reviewed and updated as indicated. Interim medical history since our last visit reviewed. Allergies and medications reviewed and updated.  Review of Systems  Constitutional:  Negative for activity change, fatigue and fever.  HENT:  Negative for congestion, ear pain, rhinorrhea and sinus pain.   Respiratory:  Negative for cough and shortness of breath.   Cardiovascular:  Negative for  chest pain, palpitations and leg swelling.  Gastrointestinal:  Negative for abdominal pain, diarrhea and nausea.  Endocrine: Negative for polydipsia and polyuria.  Genitourinary:  Negative for difficulty urinating and dysuria.  Neurological:  Negative for dizziness, light-headedness, numbness and headaches.  Psychiatric/Behavioral:  The patient is not nervous/anxious.     Per HPI unless specifically indicated above     Objective:    BP 120/76   Pulse 67   Temp 97.7 F (36.5 C) (Oral)   Ht 5' 4.41" (1.636 m)   Wt 183 lb 14.4 oz (83.4 kg)   SpO2 98%   BMI 31.17 kg/m   Wt Readings from Last 3 Encounters:  08/03/22 183 lb 14.4 oz (83.4 kg)  03/23/22 182 lb 5.8 oz (82.7 kg)  02/02/22 188 lb 11.2 oz (85.6 kg)    Physical Exam Vitals and nursing note reviewed.  Constitutional:      General: He is not in acute distress.    Appearance: Normal appearance. He is normal weight. He is not ill-appearing or toxic-appearing.  HENT:     Head: Normocephalic.     Nose: No congestion.  Eyes:     General:        Right eye: No discharge.        Left eye: No discharge.  Cardiovascular:     Rate and Rhythm: Normal rate and regular rhythm.     Pulses: Normal pulses.     Heart sounds: Normal heart sounds. No murmur heard.    No gallop.  Pulmonary:     Effort: Pulmonary effort is normal.  No respiratory distress.     Breath sounds: Normal breath sounds. No wheezing.  Abdominal:     General: Abdomen is flat.     Palpations: Abdomen is soft.  Musculoskeletal:        General: Normal range of motion.     Cervical back: Neck supple.  Skin:    General: Skin is warm.  Neurological:     General: No focal deficit present.     Mental Status: He is alert and oriented to person, place, and time. Mental status is at baseline.     Motor: No weakness.     Gait: Gait normal.  Psychiatric:        Mood and Affect: Mood normal.        Behavior: Behavior normal.        Thought Content: Thought content  normal.        Judgment: Judgment normal.     Results for orders placed or performed during the hospital encounter of 03/23/22  Surgical pathology  Result Value Ref Range   SURGICAL PATHOLOGY      SURGICAL PATHOLOGY CASE: [id number] PATIENT: [facility] Surgical Pathology Report     Specimen Submitted: A. Colon polyp x2, cecum; cbx  Clinical History: History of colon polyps. Colon polyps, diverticulosis      DIAGNOSIS: A. COLON, CECUM, POLYPS; COLD SNARE BIOPSIES: - MULTIPLE FRAGMENTS OF TUBULAR ADENOMA. - NO EVIDENCE OF HIGH-GRADE DYSPLASIA OR MALIGNANCY.   GROSS DESCRIPTION: A. Labeled: Cold biopsy polyp x 2 cecum colon Received: In formalin Collection time: 11:27 AM on 03/23/2022 Placed into formalin time: Not provided Tissue fragment(s): 2 Size: 0.5 x 0.2 x 0.1 cm Description: Aggregate of tan tissue fragments Entirely submitted in 1 cassette.  BD 03/23/2022  Final Diagnosis performed by Colton Blow, MD.   Electronically signed 03/24/2022 12:44:39PM The electronic signature indicates that the named Attending Pathologist has evaluated the specimen Technical component performed at [facility], [address] [place], [place] [address] Lab: [phone] Dir: Colton Farmer, MD, MMM  Professional component performed at [facility], [facility], [place], [place], [place] [address] Lab: [phone] Dir: Colton Simpers, MD       Assessment & Plan:   Problem List Items Addressed This Visit       Cardiovascular and Mediastinum   Essential hypertension, benign    BP at goal in office today. Continue with current medication regimen and adjust as needed. Discussed heart healthy, low sodium diet and avoidance of fried foods. Checking BP very rarely at home but when checks it is WNL. Encouraged to check BP at home at least 3 days a week.       Relevant Orders   Microalbumin, Urine Waived   Comprehensive metabolic panel     Endocrine    Impaired fasting blood sugar    A1c down to 5.7 today. Continue current medication regimen and adjust as needed. Continue to monitor and focus on diet.       Relevant Orders   Bayer DCA Hb A1c Waived   Microalbumin, Urine Waived     Hematopoietic and Hemostatic   Thrombocytopenia ([place]) - Primary    Noted on past labs. Continue current aspirin medication regimen and adjust as needed. Recheck CBC today and monitor closely.      Relevant Orders   CBC with Differential/Platelet     Other   Hyperlipidemia    Chronic, ongoing. Continue current medication regimen and adjust as needed. Continue to focus on diet.  Obtain lipid panel today.       Relevant Orders   Comprehensive metabolic panel   Lipid Panel w/o Chol/HDL Ratio   Obesity (BMI 30.0-34.9)    Pt reports losing about 20 lbs with diet and exercise. BMI 31.17 today. Continued focus on diet and exercise with healthy goal weight for BMI less than 25. Patient voiced motivation to these recommendations.       Proteinuria    Ongoing on labs.  Continue ACE for kidney protection and monitor closely.  Consider kidney ultrasound if persistent and possible referral to nephrology.      Relevant Orders   Microalbumin, Urine Waived   Other Visit Diagnoses     Pneumococcal vaccination given       PCV20 provided in office today and discussed with patient.   Relevant Orders   Pneumococcal conjugate vaccine 20-valent (Prevnar 20) (Completed)        Follow up plan: Return in about 6 months (around 02/03/2023) for WELCOME TO MEDICARE VISIT.

## 2022-08-03 NOTE — Assessment & Plan Note (Signed)
Noted on past labs. Continue current aspirin medication regimen and adjust as needed. Recheck CBC today and monitor closely.

## 2022-08-03 NOTE — Assessment & Plan Note (Signed)
BP at goal in office today. Continue with current medication regimen and adjust as needed. Discussed heart healthy, low sodium diet and avoidance of fried foods. Checking BP very rarely at home but when checks it is WNL. Encouraged to check BP at home at least 3 days a week.

## 2022-08-03 NOTE — Assessment & Plan Note (Signed)
Chronic, ongoing. Continue current medication regimen and adjust as needed. Continue to focus on diet. Obtain lipid panel today.

## 2022-08-03 NOTE — Assessment & Plan Note (Signed)
Ongoing on labs.  Continue ACE for kidney protection and monitor closely.  Consider kidney ultrasound if persistent and possible referral to nephrology.

## 2022-08-04 LAB — CBC WITH DIFFERENTIAL/PLATELET
Basophils Absolute: 0.1 10*3/uL (ref 0.0–0.2)
Basos: 1 %
EOS (ABSOLUTE): 0.2 10*3/uL (ref 0.0–0.4)
Eos: 3 %
Hematocrit: 46.9 % (ref 37.5–51.0)
Hemoglobin: 16.4 g/dL (ref 13.0–17.7)
Immature Grans (Abs): 0 10*3/uL (ref 0.0–0.1)
Immature Granulocytes: 0 %
Lymphocytes Absolute: 1.6 10*3/uL (ref 0.7–3.1)
Lymphs: 23 %
MCH: 30.7 pg (ref 26.6–33.0)
MCHC: 35 g/dL (ref 31.5–35.7)
MCV: 88 fL (ref 79–97)
Monocytes Absolute: 0.6 10*3/uL (ref 0.1–0.9)
Monocytes: 8 %
Neutrophils Absolute: 4.3 10*3/uL (ref 1.4–7.0)
Neutrophils: 65 %
Platelets: 182 10*3/uL (ref 150–450)
RBC: 5.35 x10E6/uL (ref 4.14–5.80)
RDW: 13.2 % (ref 11.6–15.4)
WBC: 6.8 10*3/uL (ref 3.4–10.8)

## 2022-08-04 LAB — COMPREHENSIVE METABOLIC PANEL
ALT: 35 IU/L (ref 0–44)
AST: 23 IU/L (ref 0–40)
Albumin/Globulin Ratio: 1.9 (ref 1.2–2.2)
Albumin: 4.6 g/dL (ref 3.9–4.9)
Alkaline Phosphatase: 69 IU/L (ref 44–121)
BUN/Creatinine Ratio: 12 (ref 10–24)
BUN: 11 mg/dL (ref 8–27)
Bilirubin Total: 0.8 mg/dL (ref 0.0–1.2)
CO2: 22 mmol/L (ref 20–29)
Calcium: 9.4 mg/dL (ref 8.6–10.2)
Chloride: 100 mmol/L (ref 96–106)
Creatinine, Ser: 0.94 mg/dL (ref 0.76–1.27)
Globulin, Total: 2.4 g/dL (ref 1.5–4.5)
Glucose: 107 mg/dL — ABNORMAL HIGH (ref 70–99)
Potassium: 4.1 mmol/L (ref 3.5–5.2)
Sodium: 138 mmol/L (ref 134–144)
Total Protein: 7 g/dL (ref 6.0–8.5)
eGFR: 90 mL/min/{1.73_m2} (ref >59)

## 2022-08-04 LAB — LIPID PANEL W/O CHOL/HDL RATIO
Cholesterol, Total: 139 mg/dL (ref 100–199)
HDL: 47 mg/dL (ref >39)
LDL Chol Calc (NIH): 68 mg/dL (ref 0–99)
Triglycerides: 134 mg/dL (ref 0–149)
VLDL Cholesterol Cal: 24 mg/dL (ref 5–40)

## 2022-08-04 NOTE — Progress Notes (Signed)
Contacted via MyChart   Good evening [facility], your labs have returned: - Kidney function, creatinine and eGFR, remains normal, as is liver function, AST and ALT.  - Cholesterol levels stable, continue Atorvastatin and diet changes. - CBC shows normal platelet count this check!!  Great news.  We will continue to monitor.  Any questions? Keep being amazing!!  Thank you for allowing me to participate in your care.  I appreciate you. Kindest regards, Eila Runyan

## 2022-12-08 ENCOUNTER — Encounter: Payer: Self-pay | Admitting: Nurse Practitioner

## 2023-01-27 DIAGNOSIS — H2513 Age-related nuclear cataract, bilateral: Secondary | ICD-10-CM | POA: Diagnosis not present

## 2023-02-04 ENCOUNTER — Encounter: Payer: Medicare PPO | Admitting: Nurse Practitioner

## 2023-02-06 NOTE — Patient Instructions (Signed)
Be Involved in Caring For Your Health:  Taking Medications When medications are taken as directed, they can greatly improve your health. But if they are not taken as prescribed, they may not work. In some cases, not taking them correctly can be harmful. To help ensure your treatment remains effective and safe, understand your medications and how to take them. Bring your medications to each visit for review by your provider.  Your lab results, notes, and after visit summary will be available on My Chart. We strongly encourage you to use this feature. If lab results are abnormal the clinic will contact you with the appropriate steps. If the clinic does not contact you assume the results are satisfactory. You can always view your results on My Chart. If you have questions regarding your health or results, please contact the clinic during office hours. You can also ask questions on My Chart.  We at [facility] are grateful that you chose [place] to provide your care. We strive to provide evidence-based and compassionate care and are always looking for feedback. If you get a survey from the clinic please complete this so we can hear your opinions.  Prediabetes Eating Plan Prediabetes is a condition that causes blood sugar (glucose) levels to be higher than normal. This increases the risk for developing type 2 diabetes (type 2 diabetes mellitus). Working with a health care provider or nutrition specialist (dietitian) to make diet and lifestyle changes can help prevent the onset of diabetes. These changes may help you: Control your blood glucose levels. Improve your cholesterol levels. Manage your blood pressure. What are tips for following this plan? Reading food labels Read food labels to check the amount of fat, salt (sodium), and sugar in prepackaged foods. Avoid foods that have: Saturated fats. Trans fats. Added sugars. Avoid foods that have more than 300 milligrams (mg) of sodium per  serving. Limit your sodium intake to less than 2,300 mg each day. Shopping Avoid buying pre-made and processed foods. Avoid buying drinks with added sugar. Cooking Cook with olive oil. Do not use butter, lard, or ghee. Bake, broil, grill, steam, or boil foods. Avoid frying. Meal planning  Work with your dietitian to create an eating plan that is right for you. This may include tracking how many calories you take in each day. Use a food diary, notebook, or mobile application to track what you eat at each meal. Consider following a Mediterranean diet. This includes: Eating several servings of fresh fruits and vegetables each day. Eating fish at least twice a week. Eating one serving each day of whole grains, beans, nuts, and seeds. Using olive oil instead of other fats. Limiting alcohol. Limiting red meat. Using nonfat or low-fat dairy products. Consider following a plant-based diet. This includes dietary choices that focus on eating mostly vegetables and fruit, grains, beans, nuts, and seeds. If you have high blood pressure, you may need to limit your sodium intake or follow a diet such as the DASH (Dietary Approaches to Stop Hypertension) eating plan. The DASH diet aims to lower high blood pressure. Lifestyle Set weight loss goals with help from your health care team. It is recommended that most people with prediabetes lose 7% of their body weight. Exercise for at least 30 minutes 5 or more days a week. Attend a support group or seek support from a mental health counselor. Take over-the-counter and prescription medicines only as told by your health care provider. What foods are recommended? Fruits Berries. Bananas. Apples. Oranges.  Grapes. Papaya. Mango. Pomegranate. Kiwi. Grapefruit. Cherries. Vegetables Lettuce. Spinach. Peas. Beets. Cauliflower. Cabbage. Broccoli. Carrots. Tomatoes. Squash. Eggplant. Herbs. Peppers. Onions. Cucumbers. Brussels sprouts. Grains Whole grains, such as  whole-wheat or whole-grain breads, crackers, cereals, and pasta. Unsweetened oatmeal. Bulgur. Barley. Quinoa. Brown rice. Corn or whole-wheat flour tortillas or taco shells. Meats and other proteins Seafood. Poultry without skin. Lean cuts of pork and beef. Tofu. Eggs. Nuts. Beans. Dairy Low-fat or fat-free dairy products, such as yogurt, cottage cheese, and cheese. Beverages Water. Tea. Coffee. Sugar-free or diet soda. Seltzer water. Low-fat or nonfat milk. Milk alternatives, such as soy or almond milk. Fats and oils Olive oil. Canola oil. Sunflower oil. Grapeseed oil. Avocado. Walnuts. Sweets and desserts Sugar-free or low-fat pudding. Sugar-free or low-fat ice cream and other frozen treats. Seasonings and condiments Herbs. Sodium-free spices. Mustard. Relish. Low-salt, low-sugar ketchup. Low-salt, low-sugar barbecue sauce. Low-fat or fat-free mayonnaise. The items listed above may not be a complete list of recommended foods and beverages. Contact a dietitian for more information. What foods are not recommended? Fruits Fruits canned with syrup. Vegetables Canned vegetables. Frozen vegetables with butter or cream sauce. Grains Refined white flour and flour products, such as bread, pasta, snack foods, and cereals. Meats and other proteins Fatty cuts of meat. Poultry with skin. Breaded or fried meat. Processed meats. Dairy Full-fat yogurt, cheese, or milk. Beverages Sweetened drinks, such as iced tea and soda. Fats and oils Butter. Lard. Ghee. Sweets and desserts Baked goods, such as cake, cupcakes, pastries, cookies, and cheesecake. Seasonings and condiments Spice mixes with added salt. Ketchup. Barbecue sauce. Mayonnaise. The items listed above may not be a complete list of foods and beverages that are not recommended. Contact a dietitian for more information. Where to find more information American Diabetes Association: www.diabetes.org Summary You may need to make diet and  lifestyle changes to help prevent the onset of diabetes. These changes can help you control blood sugar, improve cholesterol levels, and manage blood pressure. Set weight loss goals with help from your health care team. It is recommended that most people with prediabetes lose 7% of their body weight. Consider following a Mediterranean diet. This includes eating plenty of fresh fruits and vegetables, whole grains, beans, nuts, seeds, fish, and low-fat dairy, and using olive oil instead of other fats. This information is not intended to replace advice given to you by your health care provider. Make sure you discuss any questions you have with your health care provider. Document Revised: 08/09/2019 Document Reviewed: 08/09/2019 [employer]vier Patient Education  2024 [employer].

## 2023-02-08 ENCOUNTER — Ambulatory Visit (INDEPENDENT_AMBULATORY_CARE_PROVIDER_SITE_OTHER): Payer: Medicare PPO | Admitting: Nurse Practitioner

## 2023-02-08 ENCOUNTER — Encounter: Payer: Self-pay | Admitting: Nurse Practitioner

## 2023-02-08 VITALS — BP 110/67 | HR 74 | Temp 98.3°F | Ht 66.0 in | Wt 186.0 lb

## 2023-02-08 DIAGNOSIS — E7800 Pure hypercholesterolemia, unspecified: Secondary | ICD-10-CM

## 2023-02-08 DIAGNOSIS — Z0000 Encounter for general adult medical examination without abnormal findings: Secondary | ICD-10-CM | POA: Diagnosis not present

## 2023-02-08 DIAGNOSIS — R7301 Impaired fasting glucose: Secondary | ICD-10-CM | POA: Diagnosis not present

## 2023-02-08 DIAGNOSIS — E669 Obesity, unspecified: Secondary | ICD-10-CM

## 2023-02-08 DIAGNOSIS — R808 Other proteinuria: Secondary | ICD-10-CM | POA: Diagnosis not present

## 2023-02-08 DIAGNOSIS — D696 Thrombocytopenia, unspecified: Secondary | ICD-10-CM

## 2023-02-08 DIAGNOSIS — I10 Essential (primary) hypertension: Secondary | ICD-10-CM

## 2023-02-08 DIAGNOSIS — N400 Enlarged prostate without lower urinary tract symptoms: Secondary | ICD-10-CM

## 2023-02-08 DIAGNOSIS — Z23 Encounter for immunization: Secondary | ICD-10-CM

## 2023-02-08 MED ORDER — LISINOPRIL 2.5 MG PO TABS: 2.5000 mg | ORAL_TABLET | Freq: Every day | ORAL | 4 refills | Status: DC

## 2023-02-08 MED ORDER — ATORVASTATIN CALCIUM 20 MG PO TABS: 20.0000 mg | ORAL_TABLET | Freq: Every day | ORAL | 4 refills | Status: DC

## 2023-02-08 NOTE — Progress Notes (Signed)
BP 110/67   Pulse 74   Temp 98.3 F (36.8 C) (Oral)   Ht 5\' 6"  (1.676 m)   Wt 186 lb (84.4 kg)   SpO2 98%   BMI 30.02 kg/m    Subjective:    Patient ID: Colton Brown, male    DOB: 04/05/1956, 67 y.o.   MRN: [id number]  HPI: Colton Brown is a 67 y.o. male presenting on 02/08/2023 for Welcome to Medicare. Current medical complaints include:none  He currently lives with: self Interim Problems from his last visit: no  HYPERTENSION / HYPERLIPIDEMIA Continues on Lisinopril and Atorvastatin.  Has cut back on fried food.  Occasional low platelets noted on past labs, taking ASA every other day now. Satisfied with current treatment? no Duration of hypertension: years BP monitoring frequency: rarely BP range:  BP medication side effects: no Duration of hyperlipidemia: years Cholesterol medication side effects: no Cholesterol supplements: none Medication compliance: excellent compliance Aspirin: yes Recent stressors:  Recurrent headaches: no Visual changes: nono Palpitations: no Dyspnea: no Chest pain: no Lower extremity edema: no Dizzy/lightheaded: no    PREDIABETES A1c 5.7%, continues focus on diet. Polydipsia/polyuria: no Visual disturbance: no Chest pain: no Paresthesias: no   Functional Status Survey: Is the patient deaf or have difficulty hearing?: No Does the patient have difficulty seeing, even when wearing glasses/contacts?: No Does the patient have difficulty concentrating, remembering, or making decisions?: No Does the patient have difficulty walking or climbing stairs?: No Does the patient have difficulty dressing or bathing?: No Does the patient have difficulty doing errands alone such as visiting a doctor's office or shopping?: No  FALL RISK:    02/08/2023    1:12 PM 08/03/2022    8:27 AM 02/02/2022    8:19 AM 01/21/2021    8:18 AM 01/10/2020    8:09 AM  Fall Risk   Falls in the past year? 0  0 0 0  Number falls in past yr: 0 0 0 0 0  Injury with Fall?  0 0 0 0 0  Risk for fall due to : No Fall Risks No Fall Risks No Fall Risks No Fall Risks No Fall Risks  Follow up Falls evaluation completed Falls evaluation completed Falls evaluation completed Education provided Falls evaluation completed   Depression Screen    02/08/2023    1:12 PM 08/03/2022    8:27 AM 02/02/2022    8:19 AM 07/21/2021    8:22 AM 01/21/2021    8:09 AM  Depression screen PHQ 2/9  Decreased Interest 0 0 0 0 0  Down, Depressed, Hopeless 0 0 0 0 0  PHQ - 2 Score 0 0 0 0 0  Altered sleeping 1 0 3 0   Tired, decreased energy 0 0 0 0   Change in appetite 0 0 0 0   Feeling bad or failure about yourself  0 0 0 0   Trouble concentrating 0 0 0 0   Moving slowly or fidgety/restless 0 0 3 0   Suicidal thoughts 0 0 0 0   PHQ-9 Score 1 0 6 0   Difficult doing work/chores Not difficult at all Not difficult at all Somewhat difficult Not difficult at all       02/08/2023    1:12 PM 08/03/2022    8:28 AM 02/02/2022    8:20 AM 07/21/2021    8:22 AM  GAD 7 : Generalized Anxiety Score  Nervous, Anxious, on Edge 0 0 3 0  Control/stop worrying 0 0 0  0  Worry too much - different things 0 0 0 1  Trouble relaxing 0 0 3 0  Restless 0 0 3 0  Easily annoyed or irritable 0 0 3 0  Afraid - awful might happen 0 0 0 0  Total GAD 7 Score 0 0 12 1  Anxiety Difficulty Not difficult at all Not difficult at all Somewhat difficult Not difficult at all   Advanced Directives Does patient have a HCPOA?    yes If yes, name and contact information: brother Lloyd Huger Does patient have a living will or MOST form?  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   GERD (gastroesophageal reflux disease)    DUE TO GALLBLADDER-NO MEDS   Hyperlipidemia    Lumbago    Psoriasis     Surgical History:  Past Surgical History:  Procedure Laterality Date   ANKLE SURGERY Left 05/25/1987   APPENDECTOMY  05/24/1977   BACK SURGERY      CHOLECYSTECTOMY N/A 11/11/2015   Procedure: LAPAROSCOPIC CHOLECYSTECTOMY WITH INTRAOPERATIVE CHOLANGIOGRAM;  Surgeon: Gladis Riffle, MD;  Location: [facility] ORS;  Service: General;  Laterality: N/A;   COLONOSCOPY WITH PROPOFOL N/A 11/02/2016   Procedure: COLONOSCOPY WITH PROPOFOL;  Surgeon: Midge Minium, MD;  Location: [facility] ENDOSCOPY;  Service: Endoscopy;  Laterality: N/A;   COLONOSCOPY WITH PROPOFOL N/A 03/23/2022   Procedure: COLONOSCOPY WITH PROPOFOL;  Surgeon: Midge Minium, MD;  Location: [facility];  Service: Endoscopy;  Laterality: N/A;   LUMBAR DISC SURGERY     L4-L5   WISDOM TOOTH EXTRACTION      Medications:  Current Outpatient Medications on File Prior to Visit  Medication Sig   aspirin 81 MG tablet Take 81 mg by mouth daily.   Turmeric 500 MG CAPS Take 1 capsule by mouth daily.   No current facility-administered medications on file prior to visit.    Allergies:  No Known Allergies  Social History:  Social History   Socioeconomic History   Marital status: Divorced    Spouse name: Not on file   Number of children: Not on file   Years of education: Not on file   Highest education level: Not on file  Occupational History   Not on file  Tobacco Use   Smoking status: Never   Smokeless tobacco: Never  Vaping Use   Vaping status: Never Used  Substance and Sexual Activity   Alcohol use: No    Alcohol/week: 0.0 standard drinks of alcohol   Drug use: No   Sexual activity: Yes  Other Topics Concern   Not on file  Social History Narrative   Not on file   Social Determinants of Health   Financial Resource Strain: Low Risk  (02/08/2023)   Overall Financial Resource Strain (CARDIA)    Difficulty of Paying Living Expenses: Not hard at all  Food Insecurity: No Food Insecurity (02/08/2023)   Hunger Vital Sign    Worried About Running Out of Food in the Last Year: Never true    Ran Out of Food in the Last Year: Never true  Transportation Needs: No Transportation  Needs (02/08/2023)   PRAPARE - Administrator, Civil Service (Medical): No    Lack of Transportation (Non-Medical): No  Physical Activity: Sufficiently Active (02/08/2023)   Exercise Vital Sign    Days of Exercise per Week: 5 days    Minutes of Exercise per Session:  30 min  Stress: No Stress Concern Present (02/08/2023)   [employer] of Occupational Health - Occupational Stress Questionnaire    Feeling of Stress : Not at all  Social Connections: Moderately Isolated (02/08/2023)   Social Connection and Isolation Panel [NHANES]    Frequency of Communication with Friends and Family: More than three times a week    Frequency of Social Gatherings with Friends and Family: More than three times a week    Attends Religious Services: More than 4 times per year    Active Member of [employer] or Organizations: No    Attends Banker Meetings: Never    Marital Status: Divorced  Catering manager Violence: Not At Risk (02/08/2023)   Humiliation, Afraid, Rape, and Kick questionnaire    Fear of Current or Ex-Partner: No    Emotionally Abused: No    Physically Abused: No    Sexually Abused: No   Social History   Tobacco Use  Smoking Status Never  Smokeless Tobacco Never   Social History   Substance and Sexual Activity  Alcohol Use No   Alcohol/week: 0.0 standard drinks of alcohol    Family History:  Family History  Problem Relation Age of Onset   Asthma Mother    Stroke Mother    Migraines Mother    Thyroid disease Mother    Diabetes Mother    Heart disease Father    Hyperlipidemia Father    Hypertension Father    Stroke Father    Hyperlipidemia Brother    Hypertension Brother    Diabetes Maternal Grandmother    Diabetes Maternal Grandfather    Heart disease Paternal Grandmother    Past medical history, surgical history, medications, allergies, family history and social history reviewed with patient today and changes made to appropriate areas of the chart.    ROS All other ROS negative except what is listed above and in the HPI.      Objective:    BP 110/67   Pulse 74   Temp 98.3 F (36.8 C) (Oral)   Ht 5\' 6"  (1.676 m)   Wt 186 lb (84.4 kg)   SpO2 98%   BMI 30.02 kg/m   Wt Readings from Last 3 Encounters:  02/08/23 186 lb (84.4 kg)  08/03/22 183 lb 14.4 oz (83.4 kg)  03/23/22 182 lb 5.8 oz (82.7 kg)    Hearing Screening   500Hz  1000Hz  2000Hz  4000Hz   Right ear 20 25 25  40  Left ear 20 25 25  40   Vision Screening   Right eye Left eye Both eyes  Without correction 20/40 20/50 20/40   With correction      Physical Exam Vitals and nursing note reviewed.  Constitutional:      General: He is awake. He is not in acute distress.    Appearance: He is well-developed and well-groomed. He is obese. He is not ill-appearing or toxic-appearing.  HENT:     Head: Normocephalic and atraumatic.     Right Ear: Hearing, tympanic membrane, ear canal and external ear normal. No drainage.     Left Ear: Hearing, tympanic membrane, ear canal and external ear normal. No drainage.     Nose: Nose normal.     Mouth/Throat:     Pharynx: Uvula midline.  Eyes:     General: Lids are normal.        Right eye: No discharge.        Left eye: No discharge.     Extraocular Movements: Extraocular  movements intact.     Conjunctiva/sclera: Conjunctivae normal.     Pupils: Pupils are equal, round, and reactive to light.     Visual Fields: Right eye visual fields normal and left eye visual fields normal.  Neck:     Thyroid: No thyromegaly.     Vascular: No carotid bruit or JVD.     Trachea: Trachea normal.  Cardiovascular:     Rate and Rhythm: Normal rate and regular rhythm.     Heart sounds: Normal heart sounds, S1 normal and S2 normal. No murmur heard.    No gallop.  Pulmonary:     Effort: Pulmonary effort is normal. No accessory muscle usage or respiratory distress.     Breath sounds: Normal breath sounds.  Abdominal:     General: Bowel sounds are  normal.     Palpations: Abdomen is soft. There is no hepatomegaly or splenomegaly.     Tenderness: There is no abdominal tenderness.  Musculoskeletal:        General: Normal range of motion.     Cervical back: Normal range of motion and neck supple.     Right lower leg: No edema.     Left lower leg: No edema.  Lymphadenopathy:     Head:     Right side of head: No submental, submandibular, tonsillar, preauricular or posterior auricular adenopathy.     Left side of head: No submental, submandibular, tonsillar, preauricular or posterior auricular adenopathy.     Cervical: No cervical adenopathy.  Skin:    General: Skin is warm and dry.     Capillary Refill: Capillary refill takes less than 2 seconds.     Findings: No rash.  Neurological:     Mental Status: He is alert and oriented to person, place, and time.     Gait: Gait is intact.     Deep Tendon Reflexes: Reflexes are normal and symmetric.     Reflex Scores:      Brachioradialis reflexes are 2+ on the right side and 2+ on the left side.      Patellar reflexes are 2+ on the right side and 2+ on the left side. Psychiatric:        Attention and Perception: Attention normal.        Mood and Affect: Mood normal.        Speech: Speech normal.        Behavior: Behavior normal. Behavior is cooperative.        Thought Content: Thought content normal.        Cognition and Memory: Cognition normal.   EKG My review and personal interpretation at Time: 1330 Indication: Welcome to Medicare Rate: 76 Rhythm: sinus Axis: normal Other: No nonspecific st abn, no stemi, no lvh     02/08/2023    1:33 PM  6CIT Screen  What Year? 0 points  What month? 0 points  What time? 0 points  Count back from 20 0 points  Months in reverse 0 points  Repeat phrase 4 points  Total Score 4 points    Results for orders placed or performed in visit on 08/03/22  Bayer DCA Hb A1c Waived  Result Value Ref Range   HB A1C (BAYER DCA - WAIVED) 5.7 (H) 4.8 -  5.6 %  Microalbumin, Urine Waived  Result Value Ref Range   Microalb, Ur Waived 80 (H) 0 - 19 mg/L   Creatinine, Urine Waived 50 10 - 300 mg/dL   Microalb/Creat Ratio >300 (H) <30  mg/g  Comprehensive metabolic panel  Result Value Ref Range   Glucose 107 (H) 70 - 99 mg/dL   BUN 11 8 - 27 mg/dL   Creatinine, Ser 1.61 0.76 - 1.27 mg/dL   eGFR 90 >09 UE/AVW/0.98   BUN/Creatinine Ratio 12 10 - 24   Sodium 138 134 - 144 mmol/L   Potassium 4.1 3.5 - 5.2 mmol/L   Chloride 100 96 - 106 mmol/L   CO2 22 20 - 29 mmol/L   Calcium 9.4 8.6 - 10.2 mg/dL   Total Protein 7.0 6.0 - 8.5 g/dL   Albumin 4.6 3.9 - 4.9 g/dL   Globulin, Total 2.4 1.5 - 4.5 g/dL   Albumin/Globulin Ratio 1.9 1.2 - 2.2   Bilirubin Total 0.8 0.0 - 1.2 mg/dL   Alkaline Phosphatase 69 44 - 121 IU/L   AST 23 0 - 40 IU/L   ALT 35 0 - 44 IU/L  Lipid Panel w/o Chol/HDL Ratio  Result Value Ref Range   Cholesterol, Total 139 100 - 199 mg/dL   Triglycerides 119 0 - 149 mg/dL   HDL 47 >14 mg/dL   VLDL Cholesterol Cal 24 5 - 40 mg/dL   LDL Chol Calc (NIH) 68 0 - 99 mg/dL  CBC with Differential/Platelet  Result Value Ref Range   WBC 6.8 3.4 - 10.8 x10E3/uL   RBC 5.35 4.14 - 5.80 x10E6/uL   Hemoglobin 16.4 13.0 - 17.7 g/dL   Hematocrit 78.2 95.6 - 51.0 %   MCV 88 79 - 97 fL   MCH 30.7 26.6 - 33.0 pg   MCHC 35.0 31.5 - 35.7 g/dL   RDW 21.3 08.6 - 57.8 %   Platelets 182 150 - 450 x10E3/uL   Neutrophils 65 Not Estab. %   Lymphs 23 Not Estab. %   Monocytes 8 Not Estab. %   Eos 3 Not Estab. %   Basos 1 Not Estab. %   Neutrophils Absolute 4.3 1.4 - 7.0 x10E3/uL   Lymphocytes Absolute 1.6 0.7 - 3.1 x10E3/uL   Monocytes Absolute 0.6 0.1 - 0.9 x10E3/uL   EOS (ABSOLUTE) 0.2 0.0 - 0.4 x10E3/uL   Basophils Absolute 0.1 0.0 - 0.2 x10E3/uL   Immature Granulocytes 0 Not Estab. %   Immature Grans (Abs) 0.0 0.0 - 0.1 x10E3/uL      Assessment & Plan:   Problem List Items Addressed This Visit       Cardiovascular and Mediastinum    Essential hypertension, benign    Chronic, stable.  BP well below goal in office today.  Continue current medication regimen and adjust as needed.  Recommend checking BP at home at least a few mornings a week and documenting.  DASH diet focus.  LABS: CBC, CMP, TSH, lipid.  Refills sent, continue Lisinopril for proteinuria (80 March 2024).  Return in 6 months.         Relevant Medications   lisinopril (ZESTRIL) 2.5 MG tablet   atorvastatin (LIPITOR) 20 MG tablet   Other Relevant Orders   CBC with Differential/Platelet   Comprehensive metabolic panel   TSH   EKG 12-Lead (Completed)     Endocrine   Impaired fasting blood sugar    A1c 5.7% last check, remaining stable with no increase -- recheck today.  Continue to monitor and focus on diet.  Could consider Metformin, which discussed with patient.      Relevant Orders   HgB A1c     Hematopoietic and Hemostatic   Thrombocytopenia (HCC)    Noted  intermittently on past labs, has reduced Baby ASA to every other day. Level often in 140 range when low.  Recheck CBC today and monitor closely.      Relevant Orders   CBC with Differential/Platelet     Other   Hyperlipidemia    Chronic, ongoing.  Continue current medication regimen and adjust as needed.  Continue heavy focus on diet.  Obtain lipid panel today.  Refills sent.      Relevant Medications   lisinopril (ZESTRIL) 2.5 MG tablet   atorvastatin (LIPITOR) 20 MG tablet   Other Relevant Orders   Comprehensive metabolic panel   Lipid Panel w/o Chol/HDL Ratio   Obesity (BMI 30.0-34.9)    BMI 30.02.  Recommended eating smaller high protein, low fat meals more frequently and exercising 30 mins a day 5 times a week with a goal of 10-15lb weight loss in the next 3 months. Patient voiced their understanding and motivation to adhere to these recommendations.       Other Visit Diagnoses     Welcome to Medicare preventive visit    -  Primary   Welcome to Medicare due and performed  today.   Relevant Orders   EKG 12-Lead (Completed)   Benign prostatic hyperplasia without lower urinary tract symptoms       PSA on labs today.   Relevant Orders   PSA   Flu vaccine need       Flu vaccine due and provided today, educated patient.   Relevant Orders   Flu Vaccine Trivalent High Dose (Fluad) (Completed)       Discussed aspirin prophylaxis for myocardial infarction prevention and decision was made to continue ASA  LABORATORY TESTING:  Health maintenance labs ordered today as discussed above.   The natural history of prostate cancer and ongoing controversy regarding screening and potential treatment outcomes of prostate cancer has been discussed with the patient. The meaning of a false positive PSA and a false negative PSA has been discussed. He indicates understanding of the limitations of this screening test and wishes to proceed with screening PSA testing.  IMMUNIZATIONS:   - Tdap: Tetanus vaccination status reviewed: last tetanus booster within 10 years. - Influenza: Up to date - Pneumovax: Up to date - Prevnar: Up to date - Zostavax vaccine: Up to date  SCREENING: - Colonoscopy: Up to date  Discussed with patient purpose of the colonoscopy is to detect colon cancer at curable precancerous or early stages   - AAA Screening: Not applicable  -Hearing Test: Not applicable  -Spirometry: Not applicable   PATIENT COUNSELING:    Sexuality: Discussed sexually transmitted diseases, partner selection, use of condoms, avoidance of unintended pregnancy  and contraceptive alternatives.   Advised to avoid cigarette smoking.  I discussed with the patient that most people either abstain from alcohol or drink within safe limits (<=14/week and <=4 drinks/occasion for males, <=7/weeks and <= 3 drinks/occasion for females) and that the risk for alcohol disorders and other health effects rises proportionally with the number of drinks per week and how often a drinker exceeds daily  limits.  Discussed cessation/primary prevention of drug use and availability of treatment for abuse.   Diet: Encouraged to adjust caloric intake to maintain  or achieve ideal body weight, to reduce intake of dietary saturated fat and total fat, to limit sodium intake by avoiding high sodium foods and not adding table salt, and to maintain adequate dietary potassium and calcium preferably from fresh fruits, vegetables, and low-fat dairy products.  Stressed the importance of regular exercise  Injury prevention: Discussed safety belts, safety helmets, smoke detector, smoking near bedding or upholstery.   Dental health: Discussed importance of regular tooth brushing, flossing, and dental visits.   Follow up plan: NEXT PREVENTATIVE PHYSICAL DUE IN 1 YEAR. Return in about 6 months (around 08/08/2023) for HTN/HLD, PREDIABETES.

## 2023-02-08 NOTE — Assessment & Plan Note (Signed)
Chronic, ongoing.  Continue current medication regimen and adjust as needed.  Continue heavy focus on diet.  Obtain lipid panel today.  Refills sent.

## 2023-02-08 NOTE — Assessment & Plan Note (Signed)
BMI 30.02.  Recommended eating smaller high protein, low fat meals more frequently and exercising 30 mins a day 5 times a week with a goal of 10-15lb weight loss in the next 3 months. Patient voiced their understanding and motivation to adhere to these recommendations.

## 2023-02-08 NOTE — Assessment & Plan Note (Signed)
A1c 5.7% last check, remaining stable with no increase -- recheck today.  Continue to monitor and focus on diet.  Could consider Metformin, which discussed with patient.

## 2023-02-08 NOTE — Assessment & Plan Note (Signed)
Ongoing on labs.  Continue ACE for kidney protection and monitor closely.  Consider kidney ultrasound if persistent and possible referral to nephrology.  March 2024 protein was 80.

## 2023-02-08 NOTE — Assessment & Plan Note (Signed)
Noted intermittently on past labs, has reduced Baby ASA to every other day. Level often in 140 range when low.  Recheck CBC today and monitor closely.

## 2023-02-08 NOTE — Assessment & Plan Note (Signed)
Chronic, stable.  BP well below goal in office today.  Continue current medication regimen and adjust as needed.  Recommend checking BP at home at least a few mornings a week and documenting.  DASH diet focus.  LABS: CBC, CMP, TSH, lipid.  Refills sent, continue Lisinopril for proteinuria (80 March 2024).  Return in 6 months.

## 2023-02-09 NOTE — Progress Notes (Signed)
Contacted via MyChart   Good afternoon Ronak, your labs have returned and overall these are stable.  A1c remains in prediabetic range at 5.9%, continue focus on diet and exercise.  Continue all current medications.  Any questions on these? Keep being amazing!!  Thank you for allowing me to participate in your care.  I appreciate you. Kindest regards, TRUE Shackleford

## 2023-03-15 ENCOUNTER — Ambulatory Visit: Payer: Medicare PPO

## 2023-03-17 ENCOUNTER — Ambulatory Visit (INDEPENDENT_AMBULATORY_CARE_PROVIDER_SITE_OTHER): Payer: Medicare PPO

## 2023-03-17 DIAGNOSIS — Z23 Encounter for immunization: Secondary | ICD-10-CM

## 2023-08-07 NOTE — Patient Instructions (Signed)
Wegovy or Zepbound  Focus on DASH diet for high blood pressure or Mediterranean diet  Be Involved in Caring For Your Health:  Taking Medications When medications are taken as directed, they can greatly improve your health. But if they are not taken as prescribed, they may not work. In some cases, not taking them correctly can be harmful. To help ensure your treatment remains effective and safe, understand your medications and how to take them. Bring your medications to each visit for review by your provider.  Your lab results, notes, and after visit summary will be available on My Chart. We strongly encourage you to use this feature. If lab results are abnormal the clinic will contact you with the appropriate steps. If the clinic does not contact you assume the results are satisfactory. You can always view your results on My Chart. If you have questions regarding your health or results, please contact the clinic during office hours. You can also ask questions on My Chart.  We at [facility] are grateful that you chose us to provide your care. We strive to provide evidence-based and compassionate care and are always looking for feedback. If you get a survey from the clinic please complete this so we can hear your opinions.  Preventing High Cholesterol Cholesterol is a white, waxy substance similar to fat that the human body needs to help build cells. The liver makes all the cholesterol that a person's body needs. Having high cholesterol (hypercholesterolemia) increases your risk for heart disease and stroke. Extra or excess cholesterol comes from the food that you eat. High cholesterol can often be prevented with diet and lifestyle changes. If you already have high cholesterol, you can control it with diet, lifestyle changes, and medicines. How can high cholesterol affect me? If you have high cholesterol, fatty deposits (plaques) may build up on the walls of your blood vessels. The blood  vessels that carry blood away from your heart are called arteries. Plaques make the arteries narrower and stiffer. This in turn can: Restrict or block blood flow and cause blood clots to form. Increase your risk for heart attack and stroke. What can increase my risk for high cholesterol? This condition is more likely to develop in people who: Eat foods that are high in saturated fat or cholesterol. Saturated fat is mostly found in foods that come from animal sources. Are overweight. Are not getting enough exercise. Use products that contain nicotine or tobacco, such as cigarettes, e-cigarettes, and chewing tobacco. Have a family history of high cholesterol (familial hypercholesterolemia). What actions can I take to prevent this? Nutrition  Eat less saturated fat. Avoid trans fats (partially hydrogenated oils). These are often found in margarine and in some baked goods, fried foods, and snacks bought in packages. Avoid precooked or cured meat, such as bacon, sausages, or meat loaves. Avoid foods and drinks that have added sugars. Eat more fruits, vegetables, and whole grains. Choose healthy sources of protein, such as fish, poultry, lean cuts of red meat, beans, peas, lentils, and nuts. Choose healthy sources of fat, such as: Nuts. Vegetable oils, especially olive oil. Fish that have healthy fats, such as omega-3 fatty acids. These fish include mackerel or salmon. Lifestyle Lose weight if you are overweight. Maintaining a healthy body mass index (BMI) can help prevent or control high cholesterol. It can also lower your risk for diabetes and high blood pressure. Ask your health care provider to help you with a diet and exercise plan to lose   weight safely. Do not use any products that contain nicotine or tobacco. These products include cigarettes, chewing tobacco, and vaping devices, such as e-cigarettes. If you need help quitting, ask your health care provider. Alcohol use Do not drink  alcohol if: Your health care provider tells you not to drink. You are pregnant, may be pregnant, or are planning to become pregnant. If you drink alcohol: Limit how much you have to: 0-1 drink a day for women. 0-2 drinks a day for men. Know how much alcohol is in your drink. In the [place] one drink equals one 12 oz bottle of beer (355 mL), one 5 oz glass of wine (148 mL), or one 1 oz glass of hard liquor (44 mL). Activity  Get enough exercise. Do exercises as told by your health care provider. Each week, do at least 150 minutes of exercise that takes a medium level of effort (moderate-intensity exercise). This kind of exercise: Makes your heart beat faster while allowing you to still be able to talk. Can be done in short sessions several times a day or longer sessions a few times a week. For example, on 5 days each week, you could walk fast or ride your bike 3 times a day for 10 minutes each time. Medicines Your health care provider may recommend medicines to help lower cholesterol. This may be a medicine to lower the amount of cholesterol that your liver makes. You may need medicine if: Diet and lifestyle changes have not lowered your cholesterol enough. You have high cholesterol and other risk factors for heart disease or stroke. Take over-the-counter and prescription medicines only as told by your health care provider. General information Manage your risk factors for high cholesterol. Talk with your health care provider about all your risk factors and how to lower your risk. Manage other conditions that you have, such as diabetes or high blood pressure (hypertension). Have blood tests to check your cholesterol levels at regular points in time as told by your health care provider. Keep all follow-up visits. This is important. Where to find more information American Heart Association: www.heart.org National Heart, Lung, and Blood Institute: www.nhlbi.nih.gov Summary High cholesterol  increases your risk for heart disease and stroke. By keeping your cholesterol level low, you can reduce your risk for these conditions. High cholesterol can often be prevented with diet and lifestyle changes. Work with your health care provider to manage your risk factors, and have your blood tested regularly. This information is not intended to replace advice given to you by your health care provider. Make sure you discuss any questions you have with your health care provider. Document Revised: 12/11/2021 Document Reviewed: 07/14/2020 Elsevier Patient Education  2024 [employer]vier Inc.  

## 2023-08-09 ENCOUNTER — Ambulatory Visit: Payer: Self-pay | Admitting: Nurse Practitioner

## 2023-08-09 ENCOUNTER — Encounter: Payer: Self-pay | Admitting: Nurse Practitioner

## 2023-08-09 VITALS — BP 114/69 | HR 74 | Temp 97.6°F | Ht 66.0 in | Wt 191.0 lb

## 2023-08-09 DIAGNOSIS — D696 Thrombocytopenia, unspecified: Secondary | ICD-10-CM

## 2023-08-09 DIAGNOSIS — R7301 Impaired fasting glucose: Secondary | ICD-10-CM | POA: Diagnosis not present

## 2023-08-09 DIAGNOSIS — E66811 Obesity, class 1: Secondary | ICD-10-CM

## 2023-08-09 DIAGNOSIS — I10 Essential (primary) hypertension: Secondary | ICD-10-CM | POA: Diagnosis not present

## 2023-08-09 DIAGNOSIS — E7800 Pure hypercholesterolemia, unspecified: Secondary | ICD-10-CM | POA: Diagnosis not present

## 2023-08-09 DIAGNOSIS — R801 Persistent proteinuria, unspecified: Secondary | ICD-10-CM

## 2023-08-09 LAB — MICROALBUMIN, URINE WAIVED
Creatinine, Urine Waived: 50 mg/dL (ref 10–300)
Microalb, Ur Waived: 80 mg/L — ABNORMAL HIGH (ref 0–19)
Microalb/Creat Ratio: >300 mg/g — ABNORMAL HIGH (ref <30)

## 2023-08-09 LAB — BAYER DCA HB A1C WAIVED: HB A1C (BAYER DCA - WAIVED): 5.7 % — ABNORMAL HIGH (ref 4.8–5.6)

## 2023-08-09 NOTE — Assessment & Plan Note (Signed)
 Chronic, ongoing. Continue current medication regimen, will adjust as needed. Lipid panel today. Recommend continued focus on diet. Follow up in 6 months.

## 2023-08-09 NOTE — Assessment & Plan Note (Signed)
 Chronic, stable. BP at goal today. Recommend to monitor BP at home and document findings. Continue current medication regimen Lisinopril 2.5 mg daily. Continue DASH diet. Labs today CBC, CMP, Lipid panel, Microalbumin, A1c. Follow up in 6 months for physical.

## 2023-08-09 NOTE — Assessment & Plan Note (Signed)
 A1c 5.7% today! Praised for maintaining level with no increase! Recommend to continue to monitor and focus on diet and exercise. Follow up in 6 months.

## 2023-08-09 NOTE — Progress Notes (Signed)
 BP 114/69   Pulse 74   Temp 97.6 F (36.4 C) (Oral)   Ht 5\' 6"  (1.676 m)   Wt 191 lb (86.6 kg)   SpO2 99%   BMI 30.83 kg/m    Subjective:    Patient ID: Colton Brown, male    DOB: [birth year], 68 y.o.   MRN: [id number]  HPI: Colton Brown is a 68 y.o. male   Chief Complaint  Patient presents with   Hyperlipidemia   Hypertension   Prediabetes   NOTE WRITTEN BY DNP STUDENT.  ASSESSMENT AND PLAN OF CARE REVIEWED WITH STUDENT, AGREE WITH ABOVE FINDINGS AND PLAN.   HYPERTENSION / HYPERLIPIDEMIA Continues on Atorvastatin and Lisinopril.  CBC does intermittently show low platelet count. Satisfied with current treatment? yes Duration of hypertension: chronic BP monitoring frequency: rarely BP range: 124/72 BP medication side effects: no Duration of hyperlipidemia: chronic Cholesterol medication side effects: yes Cholesterol supplements: none Medication compliance: good compliance Aspirin: yes Recent stressors: no Recurrent headaches: no Visual changes: no Palpitations: no Dyspnea: no Chest pain: no Lower extremity edema: no Dizzy/lightheaded: no   Impaired Fasting Glucose Maintains cutting sugar and sweets from his diet. HbA1C:  Lab Results  Component Value Date   HGBA1C 5.9 (H) 02/08/2023  Duration of elevated blood sugar:  Polydipsia: no Polyuria: no Weight change: no Visual disturbance: no Glucose Monitoring: no    Accucheck frequency: Not Checking    Diabetic Education: Completed Family history of diabetes: yes   Relevant past medical, surgical, family and social history reviewed and updated as indicated. Interim medical history since our last visit reviewed. Allergies and medications reviewed and updated.  Review of Systems  Constitutional:  Negative for activity change, diaphoresis, fatigue and fever.  Respiratory:  Negative for cough, chest tightness, shortness of breath and wheezing.   Cardiovascular:  Negative for chest pain, palpitations and leg  swelling.  Gastrointestinal:  Negative for abdominal distention, abdominal pain, constipation, diarrhea, nausea and vomiting.  Endocrine: Negative for cold intolerance, heat intolerance, polydipsia, polyphagia and polyuria.  Musculoskeletal: Negative.   Skin: Negative.   Neurological:  Negative for dizziness, syncope, weakness, light-headedness, numbness and headaches.  Psychiatric/Behavioral: Negative.      Per HPI unless specifically indicated above     Objective:    BP 114/69   Pulse 74   Temp 97.6 F (36.4 C) (Oral)   Ht 5\' 6"  (1.676 m)   Wt 191 lb (86.6 kg)   SpO2 99%   BMI 30.83 kg/m   Wt Readings from Last 3 Encounters:  08/09/23 191 lb (86.6 kg)  02/08/23 186 lb (84.4 kg)  08/03/22 183 lb 14.4 oz (83.4 kg)    Physical Exam Vitals and nursing note reviewed.  Constitutional:      General: He is not in acute distress.    Appearance: Normal appearance. He is well-developed, well-groomed and overweight. He is not ill-appearing.  Neck:     Thyroid: No thyroid mass or thyromegaly.     Vascular: No carotid bruit.     Trachea: Trachea normal. No tracheal tenderness.  Cardiovascular:     Rate and Rhythm: Normal rate and regular rhythm.     Heart sounds: Normal heart sounds. No murmur heard. Pulmonary:     Effort: Pulmonary effort is normal. No respiratory distress.     Breath sounds: Normal breath sounds. No wheezing.  Abdominal:     General: Bowel sounds are normal. There is no distension.     Palpations: Abdomen is soft.  Tenderness: There is no abdominal tenderness.  Musculoskeletal:        General: Normal range of motion.     Cervical back: Normal range of motion and neck supple. No tenderness.     Right lower leg: No edema.     Left lower leg: No edema.  Lymphadenopathy:     Cervical: No cervical adenopathy.     Right cervical: No superficial cervical adenopathy.    Left cervical: No superficial cervical adenopathy.  Skin:    General: Skin is warm and  dry.     Findings: No bruising or rash.  Neurological:     General: No focal deficit present.     Mental Status: He is alert and oriented to person, place, and time. Mental status is at baseline.     Deep Tendon Reflexes: Reflexes are normal and symmetric.  Psychiatric:        Attention and Perception: Attention and perception normal.        Mood and Affect: Mood and affect normal.        Speech: Speech normal.        Behavior: Behavior normal. Behavior is cooperative.        Thought Content: Thought content normal.        Cognition and Memory: Cognition and memory normal.        Judgment: Judgment normal.    Results for orders placed or performed in visit on 02/08/23  CBC with Differential/Platelet   Collection Time: 02/08/23  1:52 PM  Result Value Ref Range   WBC 8.4 3.4 - 10.8 x10E3/uL   RBC 5.53 4.14 - 5.80 x10E6/uL   Hemoglobin 16.6 13.0 - 17.7 g/dL   Hematocrit 40.9 81.1 - 51.0 %   MCV 88 79 - 97 fL   MCH 30.0 26.6 - 33.0 pg   MCHC 34.2 31.5 - 35.7 g/dL   RDW 91.4 78.2 - 95.6 %   Platelets 173 150 - 450 x10E3/uL   Neutrophils 71 Not Estab. %   Lymphs 18 Not Estab. %   Monocytes 8 Not Estab. %   Eos 2 Not Estab. %   Basos 1 Not Estab. %   Neutrophils Absolute 6.1 1.4 - 7.0 x10E3/uL   Lymphocytes Absolute 1.5 0.7 - 3.1 x10E3/uL   Monocytes Absolute 0.7 0.1 - 0.9 x10E3/uL   EOS (ABSOLUTE) 0.2 0.0 - 0.4 x10E3/uL   Basophils Absolute 0.1 0.0 - 0.2 x10E3/uL   Immature Granulocytes 0 Not Estab. %   Immature Grans (Abs) 0.0 0.0 - 0.1 x10E3/uL  Comprehensive metabolic panel   Collection Time: 02/08/23  1:52 PM  Result Value Ref Range   Glucose 99 70 - 99 mg/dL   BUN 10 8 - 27 mg/dL   Creatinine, Ser 2.13 0.76 - 1.27 mg/dL   eGFR 89 >08 MV/HQI/6.96   BUN/Creatinine Ratio 11 10 - 24   Sodium 139 134 - 144 mmol/L   Potassium 3.9 3.5 - 5.2 mmol/L   Chloride 101 96 - 106 mmol/L   CO2 23 20 - 29 mmol/L   Calcium 9.3 8.6 - 10.2 mg/dL   Total Protein 7.3 6.0 - 8.5 g/dL    Albumin 4.8 3.9 - 4.9 g/dL   Globulin, Total 2.5 1.5 - 4.5 g/dL   Bilirubin Total 0.8 0.0 - 1.2 mg/dL   Alkaline Phosphatase 62 44 - 121 IU/L   AST 25 0 - 40 IU/L   ALT 40 0 - 44 IU/L  Lipid Panel w/o Chol/HDL Ratio  Collection Time: 02/08/23  1:52 PM  Result Value Ref Range   Cholesterol, Total 162 100 - 199 mg/dL   Triglycerides 272 (H) 0 - 149 mg/dL   HDL 49 >53 mg/dL   VLDL Cholesterol Cal 29 5 - 40 mg/dL   LDL Chol Calc (NIH) 84 0 - 99 mg/dL  TSH   Collection Time: 02/08/23  1:52 PM  Result Value Ref Range   TSH 2.670 0.450 - 4.500 uIU/mL  PSA   Collection Time: 02/08/23  1:52 PM  Result Value Ref Range   Prostate Specific Ag, Serum 2.3 0.0 - 4.0 ng/mL  HgB A1c   Collection Time: 02/08/23  1:52 PM  Result Value Ref Range   Hgb A1c MFr Bld 5.9 (H) 4.8 - 5.6 %   Est. average glucose Bld gHb Est-mCnc 123 mg/dL      Assessment & Plan:   Problem List Items Addressed This Visit       Cardiovascular and Mediastinum   Essential hypertension, benign   Chronic, stable. BP at goal today. Recommend to monitor BP at home and document findings. Continue current medication regimen Lisinopril 2.5 mg daily. Continue DASH diet. Labs today CBC, CMP, Lipid panel, Microalbumin, A1c. Follow up in 6 months for physical.      Relevant Orders   Comprehensive metabolic panel     Endocrine   Impaired fasting blood sugar   A1c 5.7% today! Praised for maintaining level with no increase! Recommend to continue to monitor and focus on diet and exercise. Follow up in 6 months.      Relevant Orders   Bayer DCA Hb A1c Waived   Microalbumin, Urine Waived     Hematopoietic and Hemostatic   Thrombocytopenia (HCC) - Primary   Continues ASA 81 mg every other day. Recommend to continue this regimen. Labs today CBC, CMP, Lipid panel, Microalbumin, and A1c. Follow up in 6 months.      Relevant Orders   CBC with Differential/Platelet     Other   Hyperlipidemia   Chronic, ongoing. Continue  current medication regimen, will adjust as needed. Lipid panel today. Recommend continued focus on diet. Follow up in 6 months.      Relevant Orders   Comprehensive metabolic panel   Lipid Panel w/o Chol/HDL Ratio   Obesity (BMI 30.0-34.9)   BMI 30.83. Recommend to continue focus on diet and exercise. Recommend eating smaller, more frequent meats consisting of higher protein, and lower in fat. Exercising 30 min daily at least 5 times weekly. Patient verbalized understanding of these recommendations.      Proteinuria   Ongoing. Labs today CBC, CMP, Lipid panel, Microalbumin. Continue ACE for kidney protection and continue to monitor closely. Follow up in 6 months.      Relevant Orders   Microalbumin, Urine Waived   Comprehensive metabolic panel     Follow up plan: Return in about 6 months (around 02/09/2024) for Annual Physical -- after 02/08/24.

## 2023-08-09 NOTE — Progress Notes (Deleted)
 BP 114/69   Pulse 74   Temp 97.6 F (36.4 C) (Oral)   Ht 5\' 6"  (1.676 m)   Wt 191 lb (86.6 kg)   SpO2 99%   BMI 30.83 kg/m    Subjective:    Patient ID: Colton Brown, male    DOB: 02-Oct-1955, 68 y.o.   MRN: [id number]  HPI: Jayvier Burgher is a 68 y.o. male  Chief Complaint  Patient presents with   Hyperlipidemia   Hypertension   Prediabetes   HYPERTENSION / HYPERLIPIDEMIA Continues on Atorvastatin and Lisinopril.  CBC does intermittently show low platelet count. Satisfied with current treatment? {Blank single:19197::"yes","no"} Duration of hypertension: {Blank single:19197::"chronic","months","years"} BP monitoring frequency: {Blank single:19197::"not checking","rarely","daily","weekly","monthly","a few times a day","a few times a week","a few times a month"} BP range:  BP medication side effects: {Blank single:19197::"yes","no"} Duration of hyperlipidemia: {Blank single:19197::"chronic","months","years"} Cholesterol medication side effects: {Blank single:19197::"yes","no"} Cholesterol supplements: {Blank multiple:19196::"none","fish oil","niacin","red yeast rice"} Medication compliance: {Blank single:19197::"excellent compliance","good compliance","fair compliance","poor compliance"} Aspirin: {Blank single:19197::"yes","no"} Recent stressors: {Blank single:19197::"yes","no"} Recurrent headaches: {Blank single:19197::"yes","no"} Visual changes: {Blank single:19197::"yes","no"} Palpitations: {Blank single:19197::"yes","no"} Dyspnea: {Blank single:19197::"yes","no"} Chest pain: {Blank single:19197::"yes","no"} Lower extremity edema: {Blank single:19197::"yes","no"} Dizzy/lightheaded: {Blank single:19197::"yes","no"}   Impaired Fasting Glucose HbA1C:  Lab Results  Component Value Date   HGBA1C 5.9 (H) 02/08/2023  Duration of elevated blood sugar:  Polydipsia: {Blank single:19197::"yes","no"} Polyuria: {Blank single:19197::"yes","no"} Weight change: {Blank  single:19197::"yes","no"} Visual disturbance: {Blank single:19197::"yes","no"} Glucose Monitoring: {Blank single:19197::"yes","no"}    Accucheck frequency: {Blank single:19197::"Not Checking","Daily","BID","TID"}    Fasting glucose:     Post prandial:  Diabetic Education: {Blank single:19197::"Completed","Not Completed"} Family history of diabetes: {Blank single:19197::"yes","no"}   Relevant past medical, surgical, family and social history reviewed and updated as indicated. Interim medical history since our last visit reviewed. Allergies and medications reviewed and updated.  Review of Systems  Per HPI unless specifically indicated above     Objective:    BP 114/69   Pulse 74   Temp 97.6 F (36.4 C) (Oral)   Ht 5\' 6"  (1.676 m)   Wt 191 lb (86.6 kg)   SpO2 99%   BMI 30.83 kg/m   Wt Readings from Last 3 Encounters:  08/09/23 191 lb (86.6 kg)  02/08/23 186 lb (84.4 kg)  08/03/22 183 lb 14.4 oz (83.4 kg)    Physical Exam  Results for orders placed or performed in visit on 02/08/23  CBC with Differential/Platelet   Collection Time: 02/08/23  1:52 PM  Result Value Ref Range   WBC 8.4 3.4 - 10.8 x10E3/uL   RBC 5.53 4.14 - 5.80 x10E6/uL   Hemoglobin 16.6 13.0 - 17.7 g/dL   Hematocrit 85.4 62.7 - 51.0 %   MCV 88 79 - 97 fL   MCH 30.0 26.6 - 33.0 pg   MCHC 34.2 31.5 - 35.7 g/dL   RDW 03.5 00.9 - 38.1 %   Platelets 173 150 - 450 x10E3/uL   Neutrophils 71 Not Estab. %   Lymphs 18 Not Estab. %   Monocytes 8 Not Estab. %   Eos 2 Not Estab. %   Basos 1 Not Estab. %   Neutrophils Absolute 6.1 1.4 - 7.0 x10E3/uL   Lymphocytes Absolute 1.5 0.7 - 3.1 x10E3/uL   Monocytes Absolute 0.7 0.1 - 0.9 x10E3/uL   EOS (ABSOLUTE) 0.2 0.0 - 0.4 x10E3/uL   Basophils Absolute 0.1 0.0 - 0.2 x10E3/uL   Immature Granulocytes 0 Not Estab. %   Immature Grans (Abs) 0.0 0.0 - 0.1 x10E3/uL  Comprehensive metabolic panel   Collection  Time: 02/08/23  1:52 PM  Result Value Ref Range   Glucose 99  70 - 99 mg/dL   BUN 10 8 - 27 mg/dL   Creatinine, Ser 6.29 0.76 - 1.27 mg/dL   eGFR 89 >52 WU/XLK/4.40   BUN/Creatinine Ratio 11 10 - 24   Sodium 139 134 - 144 mmol/L   Potassium 3.9 3.5 - 5.2 mmol/L   Chloride 101 96 - 106 mmol/L   CO2 23 20 - 29 mmol/L   Calcium 9.3 8.6 - 10.2 mg/dL   Total Protein 7.3 6.0 - 8.5 g/dL   Albumin 4.8 3.9 - 4.9 g/dL   Globulin, Total 2.5 1.5 - 4.5 g/dL   Bilirubin Total 0.8 0.0 - 1.2 mg/dL   Alkaline Phosphatase 62 44 - 121 IU/L   AST 25 0 - 40 IU/L   ALT 40 0 - 44 IU/L  Lipid Panel w/o Chol/HDL Ratio   Collection Time: 02/08/23  1:52 PM  Result Value Ref Range   Cholesterol, Total 162 100 - 199 mg/dL   Triglycerides 102 (H) 0 - 149 mg/dL   HDL 49 >72 mg/dL   VLDL Cholesterol Cal 29 5 - 40 mg/dL   LDL Chol Calc (NIH) 84 0 - 99 mg/dL  TSH   Collection Time: 02/08/23  1:52 PM  Result Value Ref Range   TSH 2.670 0.450 - 4.500 uIU/mL  PSA   Collection Time: 02/08/23  1:52 PM  Result Value Ref Range   Prostate Specific Ag, Serum 2.3 0.0 - 4.0 ng/mL  HgB A1c   Collection Time: 02/08/23  1:52 PM  Result Value Ref Range   Hgb A1c MFr Bld 5.9 (H) 4.8 - 5.6 %   Est. average glucose Bld gHb Est-mCnc 123 mg/dL      Assessment & Plan:   Problem List Items Addressed This Visit       Cardiovascular and Mediastinum   Essential hypertension, benign   Relevant Orders   Comprehensive metabolic panel     Endocrine   Impaired fasting blood sugar   Relevant Orders   Bayer DCA Hb A1c Waived   Microalbumin, Urine Waived     Hematopoietic and Hemostatic   Thrombocytopenia (HCC) - Primary   Relevant Orders   CBC with Differential/Platelet     Other   Hyperlipidemia   Relevant Orders   Comprehensive metabolic panel   Lipid Panel w/o Chol/HDL Ratio   Obesity (BMI 30.0-34.9)   Proteinuria   Relevant Orders   Microalbumin, Urine Waived   Comprehensive metabolic panel     Follow up plan: No follow-ups on file.

## 2023-08-09 NOTE — Assessment & Plan Note (Signed)
 Continues ASA 81 mg every other day. Recommend to continue this regimen. Labs today CBC, CMP, Lipid panel, Microalbumin, and A1c. Follow up in 6 months.

## 2023-08-09 NOTE — Assessment & Plan Note (Signed)
 Ongoing. Labs today CBC, CMP, Lipid panel, Microalbumin. Continue ACE for kidney protection and continue to monitor closely. Follow up in 6 months.

## 2023-08-09 NOTE — Assessment & Plan Note (Signed)
 BMI 30.83. Recommend to continue focus on diet and exercise. Recommend eating smaller, more frequent meats consisting of higher protein, and lower in fat. Exercising 30 min daily at least 5 times weekly. Patient verbalized understanding of these recommendations.

## 2023-08-10 ENCOUNTER — Encounter: Payer: Self-pay | Admitting: Nurse Practitioner

## 2023-08-10 LAB — CBC WITH DIFFERENTIAL/PLATELET
Basophils Absolute: 0.1 10*3/uL (ref 0.0–0.2)
Basos: 1 %
EOS (ABSOLUTE): 0.2 10*3/uL (ref 0.0–0.4)
Eos: 4 %
Hematocrit: 47.7 % (ref 37.5–51.0)
Hemoglobin: 16.5 g/dL (ref 13.0–17.7)
Immature Grans (Abs): 0 10*3/uL (ref 0.0–0.1)
Immature Granulocytes: 0 %
Lymphocytes Absolute: 1.6 10*3/uL (ref 0.7–3.1)
Lymphs: 25 %
MCH: 30.6 pg (ref 26.6–33.0)
MCHC: 34.6 g/dL (ref 31.5–35.7)
MCV: 89 fL (ref 79–97)
Monocytes Absolute: 0.6 10*3/uL (ref 0.1–0.9)
Monocytes: 9 %
Neutrophils Absolute: 4 10*3/uL (ref 1.4–7.0)
Neutrophils: 61 %
Platelets: 152 10*3/uL (ref 150–450)
RBC: 5.39 x10E6/uL (ref 4.14–5.80)
RDW: 13.4 % (ref 11.6–15.4)
WBC: 6.5 10*3/uL (ref 3.4–10.8)

## 2023-08-10 LAB — COMPREHENSIVE METABOLIC PANEL
ALT: 36 IU/L (ref 0–44)
AST: 23 IU/L (ref 0–40)
Albumin: 4.6 g/dL (ref 3.9–4.9)
Alkaline Phosphatase: 66 IU/L (ref 44–121)
BUN/Creatinine Ratio: 13 (ref 10–24)
BUN: 12 mg/dL (ref 8–27)
Bilirubin Total: 0.9 mg/dL (ref 0.0–1.2)
CO2: 23 mmol/L (ref 20–29)
Calcium: 9.5 mg/dL (ref 8.6–10.2)
Chloride: 100 mmol/L (ref 96–106)
Creatinine, Ser: 0.92 mg/dL (ref 0.76–1.27)
Globulin, Total: 2.3 g/dL (ref 1.5–4.5)
Glucose: 111 mg/dL — ABNORMAL HIGH (ref 70–99)
Potassium: 4.1 mmol/L (ref 3.5–5.2)
Sodium: 137 mmol/L (ref 134–144)
Total Protein: 6.9 g/dL (ref 6.0–8.5)
eGFR: 92 mL/min/{1.73_m2} (ref >59)

## 2023-08-10 LAB — LIPID PANEL W/O CHOL/HDL RATIO
Cholesterol, Total: 138 mg/dL (ref 100–199)
HDL: 45 mg/dL (ref >39)
LDL Chol Calc (NIH): 72 mg/dL (ref 0–99)
Triglycerides: 118 mg/dL (ref 0–149)
VLDL Cholesterol Cal: 21 mg/dL (ref 5–40)

## 2023-08-10 NOTE — Progress Notes (Signed)
 Contacted via MyChart   Good afternoon Colton Brown, your labs have returned and look fantastic!! - Kidney function, creatinine and eGFR, remains normal, as is liver function, AST and ALT.  - Lipid panel stable.  Blood counts with no anemia or infection noted.  No medication changes needed. Keep being amazing!!  Thank you for allowing me to participate in your care.  I appreciate you. Kindest regards, Resha Filippone

## 2024-02-08 DIAGNOSIS — H2513 Age-related nuclear cataract, bilateral: Secondary | ICD-10-CM | POA: Diagnosis not present

## 2024-02-12 NOTE — Patient Instructions (Signed)
 Be Involved in Caring For Your Health:  Taking Medications When medications are taken as directed, they can greatly improve your health. But if they are not taken as prescribed, they may not work. In some cases, not taking them correctly can be harmful. To help ensure your treatment remains effective and safe, understand your medications and how to take them. Bring your medications to each visit for review by your provider.  Your lab results, notes, and after visit summary will be available on My Chart. We strongly encourage you to use this feature. If lab results are abnormal the clinic will contact you with the appropriate steps. If the clinic does not contact you assume the results are satisfactory. You can always view your results on My Chart. If you have questions regarding your health or results, please contact the clinic during office hours. You can also ask questions on My Chart.  We at [facility] are grateful that you chose us  to provide your care. We strive to provide evidence-based and compassionate care and are always looking for feedback. If you get a survey from the clinic please complete this so we can hear your opinions.  Healthy Eating, Adult Healthy eating may help you get and keep a healthy body weight, reduce the risk of chronic disease, and live a long and productive life. It is important to follow a healthy eating pattern. Your nutritional and calorie needs should be met mainly by different nutrient-rich foods. What are tips for following this plan? Reading food labels Read labels and choose the following: Reduced or low sodium products. Juices with 100% fruit juice. Foods with low saturated fats (<3 g per serving) and high polyunsaturated and monounsaturated fats. Foods with whole grains, such as whole wheat, cracked wheat, brown rice, and wild rice. Whole grains that are fortified with folic acid. This is recommended for females who are pregnant or who want to  become pregnant. Read labels and do not eat or drink the following: Foods or drinks with added sugars. These include foods that contain brown sugar, corn sweetener, corn syrup, dextrose , fructose, glucose, high-fructose corn syrup, honey, invert sugar, lactose, malt syrup, maltose, molasses, raw sugar, sucrose, trehalose, or turbinado sugar. Limit your intake of added sugars to less than 10% of your total daily calories. Do not eat more than the following amounts of added sugar per day: 6 teaspoons (25 g) for females. 9 teaspoons (38 g) for males. Foods that contain processed or refined starches and grains. Refined grain products, such as white flour, degermed cornmeal, white bread, and white rice. Shopping Choose nutrient-rich snacks, such as vegetables, whole fruits, and nuts. Avoid high-calorie and high-sugar snacks, such as potato chips, fruit snacks, and candy. Use oil-based dressings and spreads on foods instead of solid fats such as butter, margarine, sour cream, or cream cheese. Limit pre-made sauces, mixes, and instant products such as flavored rice, instant noodles, and ready-made pasta. Try more plant-protein sources, such as tofu, tempeh, black beans, edamame, lentils, nuts, and seeds. Explore eating plans such as the Mediterranean diet or vegetarian diet. Try heart-healthy dips made with beans and healthy fats like hummus and guacamole. Vegetables go great with these. Cooking Use oil to saut or stir-fry foods instead of solid fats such as butter, margarine, or lard. Try baking, boiling, grilling, or broiling instead of frying. Remove the fatty part of meats before cooking. Steam vegetables in water  or broth. Meal planning  At meals, imagine dividing your plate into fourths: One-half of  your plate is fruits and vegetables. One-fourth of your plate is whole grains. One-fourth of your plate is protein, especially lean meats, poultry, eggs, tofu, beans, or nuts. Include low-fat  dairy as part of your daily diet. Lifestyle Choose healthy options in all settings, including home, work, school, restaurants, or stores. Prepare your food safely: Wash your hands after handling raw meats. Where you prepare food, keep surfaces clean by regularly washing with hot, soapy water . Keep raw meats separate from ready-to-eat foods, such as fruits and vegetables. Cook seafood, meat, poultry, and eggs to the recommended temperature. Get a food thermometer. Store foods at safe temperatures. In general: Keep cold foods at 84F (4.4C) or below. Keep hot foods at 184F (60C) or above. Keep your freezer at [facility] (-17.8C) or below. Foods are not safe to eat if they have been between the temperatures of 40-184F (4.4-60C) for more than 2 hours. What foods should I eat? Fruits Aim to eat 1-2 cups of fresh, canned (in natural juice), or frozen fruits each day. One cup of fruit equals 1 small apple, 1 large banana, 8 large strawberries, 1 cup (237 g) canned fruit,  cup (82 g) dried fruit, or 1 cup (240 mL) 100% juice. Vegetables Aim to eat 2-4 cups of fresh and frozen vegetables each day, including different varieties and colors. One cup of vegetables equals 1 cup (91 g) broccoli or cauliflower florets, 2 medium carrots, 2 cups (150 g) raw, leafy greens, 1 large tomato, 1 large bell pepper, 1 large sweet potato, or 1 medium white potato. Grains Aim to eat 5-10 ounce-equivalents of whole grains each day. Examples of 1 ounce-equivalent of grains include 1 slice of bread, 1 cup (40 g) ready-to-eat cereal, 3 cups (24 g) popcorn, or  cup (93 g) cooked rice. Meats and other proteins Try to eat 5-7 ounce-equivalents of protein each day. Examples of 1 ounce-equivalent of protein include 1 egg,  oz nuts (12 almonds, 24 pistachios, or 7 walnut halves), 1/4 cup (90 g) cooked beans, 6 tablespoons (90 g) hummus or 1 tablespoon (16 g) peanut butter. A cut of meat or fish that is the size of a deck of  cards is about 3-4 ounce-equivalents (85 g). Of the protein you eat each week, try to have at least 8 sounce (227 g) of seafood. This is about 2 servings per week. This includes salmon, trout, herring, sardines, and anchovies. Dairy Aim to eat 3 cup-equivalents of fat-free or low-fat dairy each day. Examples of 1 cup-equivalent of dairy include 1 cup (240 mL) milk, 8 ounces (250 g) yogurt, 1 ounces (44 g) natural cheese, or 1 cup (240 mL) fortified soy milk. Fats and oils Aim for about 5 teaspoons (21 g) of fats and oils per day. Choose monounsaturated fats, such as canola and olive oils, mayonnaise made with olive oil or avocado oil, avocados, peanut butter, and most nuts, or polyunsaturated fats, such as sunflower, corn, and soybean oils, walnuts, pine nuts, sesame seeds, sunflower seeds, and flaxseed. Beverages Aim for 6 eight-ounce glasses of water  per day. Limit coffee to 3-5 eight-ounce cups per day. Limit caffeinated beverages that have added calories, such as soda and energy drinks. If you drink alcohol: Limit how much you have to: 0-1 drink a day if you are male. 0-2 drinks a day if you are male. Know how much alcohol is in your drink. In the [place] one drink is one 12 oz bottle of beer (355 mL), one 5 oz glass of wine (  148 mL), or one 1 oz glass of hard liquor (44 mL). Seasoning and other foods Try not to add too much salt to your food. Try using herbs and spices instead of salt. Try not to add sugar to food. This information is based on [place] nutrition guidelines. To learn more, visit DisposableN[employer].be. Exact amounts may vary. You may need different amounts. This information is not intended to replace advice given to you by your health care provider. Make sure you discuss any questions you have with your health care provider. Document Revised: 02/08/2022 Document Reviewed: 02/08/2022 Elsevier Patient Education  2024 [employer].

## 2024-02-14 ENCOUNTER — Encounter: Payer: Self-pay | Admitting: Nurse Practitioner

## 2024-02-14 ENCOUNTER — Ambulatory Visit (INDEPENDENT_AMBULATORY_CARE_PROVIDER_SITE_OTHER): Admitting: Nurse Practitioner

## 2024-02-14 VITALS — BP 121/74 | HR 75 | Temp 98.1°F | Resp 15 | Ht 65.98 in | Wt 186.8 lb

## 2024-02-14 DIAGNOSIS — D696 Thrombocytopenia, unspecified: Secondary | ICD-10-CM

## 2024-02-14 DIAGNOSIS — I10 Essential (primary) hypertension: Secondary | ICD-10-CM

## 2024-02-14 DIAGNOSIS — Z23 Encounter for immunization: Secondary | ICD-10-CM | POA: Diagnosis not present

## 2024-02-14 DIAGNOSIS — E7800 Pure hypercholesterolemia, unspecified: Secondary | ICD-10-CM | POA: Diagnosis not present

## 2024-02-14 DIAGNOSIS — R7301 Impaired fasting glucose: Secondary | ICD-10-CM

## 2024-02-14 DIAGNOSIS — E66811 Obesity, class 1: Secondary | ICD-10-CM

## 2024-02-14 DIAGNOSIS — Z0000 Encounter for general adult medical examination without abnormal findings: Secondary | ICD-10-CM

## 2024-02-14 DIAGNOSIS — N400 Enlarged prostate without lower urinary tract symptoms: Secondary | ICD-10-CM | POA: Diagnosis not present

## 2024-02-14 LAB — BAYER DCA HB A1C WAIVED: HB A1C (BAYER DCA - WAIVED): 5.8 % — ABNORMAL HIGH (ref 4.8–5.6)

## 2024-02-14 MED ORDER — ATORVASTATIN CALCIUM 20 MG PO TABS: 20.0000 mg | ORAL_TABLET | Freq: Every day | ORAL | 4 refills | Status: AC

## 2024-02-14 MED ORDER — LISINOPRIL 2.5 MG PO TABS: 2.5000 mg | ORAL_TABLET | Freq: Every day | ORAL | 4 refills | Status: AC

## 2024-02-14 NOTE — Assessment & Plan Note (Signed)
Chronic, ongoing.  Continue current medication regimen and adjust as needed.  Continue heavy focus on diet.  Obtain lipid panel today.  Refills sent.

## 2024-02-14 NOTE — Assessment & Plan Note (Signed)
BMI 30.16.  Recommended eating smaller high protein, low fat meals more frequently and exercising 30 mins a day 5 times a week with a goal of 10-15lb weight loss in the next 3 months. Patient voiced their understanding and motivation to adhere to these recommendations.  

## 2024-02-14 NOTE — Assessment & Plan Note (Signed)
 Chronic, stable.  BP well below goal in office today.  Continue current medication regimen and adjust as needed.  Recommend checking BP at home at least a few mornings a week and documenting.  DASH diet focus.  LABS: CBC, CMP, TSH, lipid.  Refills sent, continue Lisinopril  for proteinuria (80 March 2025).  Return in 6 months.

## 2024-02-14 NOTE — Assessment & Plan Note (Signed)
Noted intermittently on past labs, has reduced Baby ASA to every other day. Level often in 140 range when low.  Recheck CBC today and monitor closely.

## 2024-02-14 NOTE — Progress Notes (Signed)
 Subjective:   Colton Brown is a 68 y.o. male who presents for Medicare Annual/Subsequent preventive examination.  Visit Complete: In person  Patient Medicare AWV questionnaire was completed by the patient on target organ damage ; I have confirmed that all information answered by patient is correct and no changes since this date.  Cardiac Risk Factors include: advanced age (>75men, >25 women);family history of premature cardiovascular disease;hypertension;male gender     Objective:    Today's Vitals   02/14/24 0817  BP: 121/74  Pulse: 75  Resp: 15  Temp: 98.1 F (36.7 C)  TempSrc: Oral  SpO2: 97%  Weight: 186 lb 12.8 oz (84.7 kg)  Height: 5' 5.98 (1.676 m)  PainSc: 0-No pain   Body mass index is 30.16 kg/m.     02/14/2024    8:14 AM 03/23/2022   10:19 AM 11/02/2016   10:17 AM 09/11/2016   11:27 AM 11/11/2015    5:57 AM  Advanced Directives  Does Patient Have a Medical Advance Directive? No Yes Yes  Yes  Yes   Type of Special educational needs teacher of [place];Living will Healthcare Power of [place];Living will Living will   Copy of Healthcare Power of Attorney in Chart?   No - copy requested     Would patient like information on creating a medical advance directive? Yes (MAU/Ambulatory/Procedural Areas - Information given)         Data saved with a previous flowsheet row definition    Current Medications (verified) Outpatient Encounter Medications as of 02/14/2024  Medication Sig   aspirin 81 MG tablet Take 81 mg by mouth daily.   atorvastatin  (LIPITOR) 20 MG tablet Take 1 tablet (20 mg total) by mouth daily.   lisinopril  (ZESTRIL ) 2.5 MG tablet Take 1 tablet (2.5 mg total) by mouth daily.   Turmeric 500 MG CAPS Take 1 capsule by mouth daily.   [DISCONTINUED] atorvastatin  (LIPITOR) 20 MG tablet Take 1 tablet (20 mg total) by mouth daily.   [DISCONTINUED] lisinopril  (ZESTRIL ) 2.5 MG tablet Take 1 tablet (2.5 mg total) by mouth daily.   No facility-administered  encounter medications on file as of 02/14/2024.    Allergies (verified) Patient has no known allergies.   History: Past Medical History:  Diagnosis Date   Chronic kidney disease    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   GERD (gastroesophageal reflux disease)    DUE TO GALLBLADDER-NO MEDS   Hyperlipidemia    Lumbago    Psoriasis    Past Surgical History:  Procedure Laterality Date   ANKLE SURGERY Left 05/25/1987   APPENDECTOMY  05/24/1977   BACK SURGERY     CHOLECYSTECTOMY N/A 11/11/2015   Procedure: LAPAROSCOPIC CHOLECYSTECTOMY WITH INTRAOPERATIVE CHOLANGIOGRAM;  Surgeon: Dorothyann LITTIE Husk, MD;  Location: [facility] ORS;  Service: General;  Laterality: N/A;   COLONOSCOPY WITH PROPOFOL  N/A 11/02/2016   Procedure: COLONOSCOPY WITH PROPOFOL ;  Surgeon: Jinny Carmine, MD;  Location: [facility] ENDOSCOPY;  Service: Endoscopy;  Laterality: N/A;   COLONOSCOPY WITH PROPOFOL  N/A 03/23/2022   Procedure: COLONOSCOPY WITH PROPOFOL ;  Surgeon: Jinny Carmine, MD;  Location: [facility] ENDOSCOPY;  Service: Endoscopy;  Laterality: N/A;   LUMBAR DISC SURGERY     L4-L5   WISDOM TOOTH EXTRACTION     Family History  Problem Relation Age of Onset   Asthma Mother    Stroke Mother    Migraines Mother    Thyroid disease Mother    Diabetes Mother    Heart disease Father    Hyperlipidemia  Father    Hypertension Father    Stroke Father    Hyperlipidemia Brother    Hypertension Brother    Diabetes Maternal Grandmother    Diabetes Maternal Grandfather    Heart disease Paternal Grandmother    Social History   Socioeconomic History   Marital status: Divorced    Spouse name: Not on file   Number of children: Not on file   Years of education: Not on file   Highest education level: Not on file  Occupational History   Not on file  Tobacco Use   Smoking status: Never   Smokeless tobacco: Never  Vaping Use   Vaping status: Never Used  Substance and Sexual Activity   Alcohol use: No    Alcohol/week: 0.0 standard drinks of  alcohol   Drug use: No   Sexual activity: Yes  Other Topics Concern   Not on file  Social History Narrative   Not on file   Social Drivers of Health   Financial Resource Strain: Low Risk  (02/08/2023)   Overall Financial Resource Strain (CARDIA)    Difficulty of Paying Living Expenses: Not hard at all  Food Insecurity: No Food Insecurity (02/08/2023)   Hunger Vital Sign    Worried About Running Out of Food in the Last Year: Never true    Ran Out of Food in the Last Year: Never true  Transportation Needs: No Transportation Needs (02/08/2023)   PRAPARE - Administrator, Civil Service (Medical): No    Lack of Transportation (Non-Medical): No  Physical Activity: Sufficiently Active (02/14/2024)   Exercise Vital Sign    Days of Exercise per Week: 7 days    Minutes of Exercise per Session: 100 min  Stress: No Stress Concern Present (02/08/2023)   [employer] of Occupational Health - Occupational Stress Questionnaire    Feeling of Stress : Not at all  Social Connections: Moderately Isolated (02/14/2024)   Social Connection and Isolation Panel    Frequency of Communication with Friends and Family: Twice a week    Frequency of Social Gatherings with Friends and Family: Once a week    Attends Religious Services: More than 4 times per year    Active Member of [employer] or Organizations: No    Attends Engineer, structural: Never    Marital Status: Divorced    Tobacco Counseling Counseling given: Not Answered   Clinical Intake:     Pain Score: 0-No pain     Diabetes: No  How often do you need to have someone help you when you read instructions, pamphlets, or other written materials from your doctor or pharmacy?: 1 - Never  Interpreter Needed?: No      Activities of Daily Living    02/14/2024    8:13 AM  In your present state of health, do you have any difficulty performing the following activities:  Hearing? 0  Vision? 0  Difficulty concentrating or  making decisions? 0  Walking or climbing stairs? 0  Dressing or bathing? 0  Doing errands, shopping? 0  Preparing Food and eating ? N  Using the Toilet? N  In the past six months, have you accidently leaked urine? N  Do you have problems with loss of bowel control? N  Managing your Medications? N  Managing your Finances? N  Housekeeping or managing your Housekeeping? N    Patient Care Team: Cannady, Jolene T, NP as PCP - General (Nurse Practitioner)  Indicate any recent Medical Services you  may have received from other than [facility] providers in the past year (date may be approximate).     Assessment:   This is a routine wellness examination for [employer].  Hearing/Vision screen No results found.   Goals Addressed               This Visit's Progress     Weight (lb) < 175 lb (79.4 kg) (pt-stated)   186 lb 12.8 oz (84.7 kg)      Depression Screen    02/14/2024    8:09 AM 08/09/2023    8:32 AM 02/08/2023    1:12 PM 08/03/2022    8:27 AM 02/02/2022    8:19 AM 07/21/2021    8:22 AM 01/21/2021    8:09 AM  PHQ 2/9 Scores  PHQ - 2 Score 0 0 0 0 0 0 0  PHQ- 9 Score 0 0 1 0 6 0     Fall Risk    02/14/2024    8:09 AM 08/09/2023    8:32 AM 02/08/2023    1:12 PM 08/03/2022    8:27 AM 02/02/2022    8:19 AM  Fall Risk   Falls in the past year? 0 0 0  0  Number falls in past yr: 0 0 0 0 0  Injury with Fall? 0 0 0 0 0  Risk for fall due to : No Fall Risks No Fall Risks No Fall Risks No Fall Risks No Fall Risks  Follow up Falls evaluation completed Falls evaluation completed Falls evaluation completed Falls evaluation completed Falls evaluation completed      Data saved with a previous flowsheet row definition    MEDICARE RISK AT HOME: Medicare Risk at Home Any stairs in or around the home?: Yes If so, are there any without handrails?: Yes Home free of loose throw rugs in walkways, pet beds, electrical cords, etc?: Yes Adequate lighting in your home to reduce risk of falls?:  Yes Life alert?: No Use of a cane, walker or w/c?: No Grab bars in the bathroom?: Yes Shower chair or bench in shower?: No Elevated toilet seat or a handicapped toilet?: No  TIMED UP AND GO:  Was the test performed?  Yes  Length of time to ambulate 10 feet: 8 sec Gait steady and fast with assistive device    Cognitive Function:        02/14/2024    8:15 AM 02/08/2023    1:33 PM  6CIT Screen  What Year? 0 points 0 points  What month? 0 points 0 points  What time? 0 points 0 points  Count back from 20 0 points 0 points  Months in reverse 0 points 0 points  Repeat phrase 2 points 4 points  Total Score 2 points 4 points    Immunizations Immunization History  Administered Date(s) Administered   Fluad Trivalent(High Dose 65+) 02/08/2023   INFLUENZA, HIGH DOSE SEASONAL PF 02/14/2024   Influenza,inj,Quad PF,6+ Mos 02/25/2015, 03/12/2016, 03/16/2017, 03/20/2018, 02/27/2020, 03/10/2021, 02/02/2022   PFIZER Comirnaty(Gray Top)Covid-19 Tri-Sucrose Vaccine 09/09/2020   PFIZER(Purple Top)SARS-COV-2 Vaccination 08/14/2019, 09/04/2019, 04/15/2020, 09/09/2020, 03/10/2021   PNEUMOCOCCAL CONJUGATE-20 08/03/2022   Pfizer Covid-19 Vaccine Bivalent Booster 21yrs & up 03/10/2021   Pfizer(Comirnaty)Fall Seasonal Vaccine 12 years and older 04/09/2022, 03/17/2023, 02/14/2024   Td 01/22/2003   Tdap 05/24/2008, 10/06/2018   Zoster Recombinant(Shingrix) 10/17/2019, 12/21/2019    TDAP status: Up to date  Flu Vaccine status: Up to date  Pneumococcal vaccine status: Up to date  Covid-19 vaccine status:  Completed vaccines  Qualifies for Shingles Vaccine? Yes   Zostavax completed No   Shingrix Completed?: Yes  Screening Tests Health Maintenance  Topic Date Due   Medicare Annual Wellness (AWV)  02/13/2025   DTaP/Tdap/Td (4 - Td or Tdap) 10/05/2028   Colonoscopy  03/23/2029   Pneumococcal Vaccine: 50+ Years  Completed   Influenza Vaccine  Completed   COVID-19 Vaccine  Completed    Hepatitis C Screening  Completed   Zoster Vaccines- Shingrix  Completed   HPV VACCINES  Aged Out   Meningococcal B Vaccine  Aged Out    Health Maintenance  There are no preventive care reminders to display for this patient.  Colorectal cancer screening: Type of screening: Colonoscopy. Completed 03/23/2022. Repeat every 7 years  Lung Cancer Screening: (Low Dose CT Chest recommended if Age 35-80 years, 20 pack-year currently smoking OR have quit w/in 15years.) does not qualify.   Additional Screening:  Hepatitis C Screening: does qualify; Completed 03/12/2016  Vision Screening: Recommended annual ophthalmology exams for early detection of glaucoma and other disorders of the eye. Is the patient up to date with their annual eye exam?  Yes   Dental Screening: Recommended annual dental exams for proper oral hygiene  Community Resource Referral / Chronic Care Management: CRR required this visit?  Yes   CCM required this visit?  No     Plan:     I have personally reviewed and noted the following in the patient's chart:   Medical and social history Use of alcohol, tobacco or illicit drugs  Current medications and supplements including opioid prescriptions. Patient is not currently taking opioid prescriptions. Functional ability and status Nutritional status Physical activity Advanced directives List of other physicians Hospitalizations, surgeries, and ER visits in previous 12 months Vitals Screenings to include cognitive, depression, and falls Referrals and appointments  In addition, I have reviewed and discussed with patient certain preventive protocols, quality metrics, and best practice recommendations. A written personalized care plan for preventive services as well as general preventive health recommendations were provided to patient.     Comer HERO Raesean Bartoletti, CMA   02/14/2024   After Visit Summary: (In Person-Printed) AVS printed and given to the patient

## 2024-02-14 NOTE — Progress Notes (Signed)
 BP 121/74 (BP Location: Left Arm, Patient Position: Sitting, Cuff Size: Normal)   Pulse 75   Temp 98.1 F (36.7 C) (Oral)   Resp 15   Ht 5' 5.98 (1.676 m)   Wt 186 lb 12.8 oz (84.7 kg)   SpO2 97%   BMI 30.16 kg/m    Subjective:    Patient ID: Colton Brown, male    DOB: Jun 29, 1955, 68 y.o.   MRN: [id number]  HPI: Colton Brown is a 68 y.o. male presenting on 02/14/2024 for annual physical exam. Current medical complaints include:none  He currently lives with: self Interim Problems from his last visit: no  HYPERTENSION / HYPERLIPIDEMIA Taking Lisinopril  and Atorvastatin . Lost 5 lbs since last visit. Intermittent low platelets noted on past labs, taking ASA every other day now. Satisfied with current treatment? no Duration of hypertension: years BP monitoring frequency: rarely BP range:  BP medication side effects: no Duration of hyperlipidemia: years Cholesterol medication side effects: no Cholesterol supplements: none Medication compliance: excellent compliance Aspirin: yes Recent stressors:  Recurrent headaches: no Visual changes: nono Palpitations: no Dyspnea: no Chest pain: no Lower extremity edema: no Dizzy/lightheaded: no    Impaired Fasting Glucose HbA1C:  Lab Results  Component Value Date   HGBA1C 5.7 (H) 08/09/2023  Duration of elevated blood sugar: chronic Polydipsia: no Polyuria: no Weight change: no Visual disturbance: no Glucose Monitoring: no    Accucheck frequency: Not Checking    Fasting glucose:     Post prandial:  Diabetic Education: Not Completed Family history of diabetes: yes -- mother used insulin   Functional Status Survey: Is the patient deaf or have difficulty hearing?: No Does the patient have difficulty seeing, even when wearing glasses/contacts?: No Does the patient have difficulty concentrating, remembering, or making decisions?: No Does the patient have difficulty walking or climbing stairs?: No Does the patient have difficulty  dressing or bathing?: No Does the patient have difficulty doing errands alone such as visiting a doctor's office or shopping?: No  FALL RISK:    02/14/2024    8:09 AM 08/09/2023    8:32 AM 02/08/2023    1:12 PM 08/03/2022    8:27 AM 02/02/2022    8:19 AM  Fall Risk   Falls in the past year? 0 0 0  0  Number falls in past yr: 0 0 0 0 0  Injury with Fall? 0 0 0 0 0  Risk for fall due to : No Fall Risks No Fall Risks No Fall Risks No Fall Risks No Fall Risks  Follow up Falls evaluation completed Falls evaluation completed Falls evaluation completed Falls evaluation completed Falls evaluation completed      Data saved with a previous flowsheet row definition   Depression Screen    02/14/2024    8:09 AM 08/09/2023    8:32 AM 02/08/2023    1:12 PM 08/03/2022    8:27 AM 02/02/2022    8:19 AM  Depression screen PHQ 2/9  Decreased Interest 0 0 0 0 0  Down, Depressed, Hopeless 0 0 0 0 0  PHQ - 2 Score 0 0 0 0 0  Altered sleeping 0 0 1 0 3  Tired, decreased energy 0 0 0 0 0  Change in appetite 0 0 0 0 0  Feeling bad or failure about yourself  0 0 0 0 0  Trouble concentrating 0 0 0 0 0  Moving slowly or fidgety/restless 0 0 0 0 3  Suicidal thoughts 0 0 0 0  0  PHQ-9 Score 0 0 1 0 6  Difficult doing work/chores Not difficult at all Not difficult at all Not difficult at all Not difficult at all Somewhat difficult      02/14/2024    8:09 AM 08/09/2023    8:33 AM 02/08/2023    1:12 PM 08/03/2022    8:28 AM  GAD 7 : Generalized Anxiety Score  Nervous, Anxious, on Edge 0 0 0 0  Control/stop worrying 0 0 0 0  Worry too much - different things 0 0 0 0  Trouble relaxing 0 0 0 0  Restless 0 0 0 0  Easily annoyed or irritable 0 0 0 0  Afraid - awful might happen 0 0 0 0  Total GAD 7 Score 0 0 0 0  Anxiety Difficulty Not difficult at all Not difficult at all Not difficult at all Not difficult at all   Advanced Directives Does patient have a HCPOA?    yes If yes, name and contact information:  brother Aureliano Does patient have a living will or MOST form?  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   GERD (gastroesophageal reflux disease)    DUE TO GALLBLADDER-NO MEDS   Hyperlipidemia    Lumbago    Psoriasis     Surgical History:  Past Surgical History:  Procedure Laterality Date   ANKLE SURGERY Left 05/25/1987   APPENDECTOMY  05/24/1977   BACK SURGERY     CHOLECYSTECTOMY N/A 11/11/2015   Procedure: LAPAROSCOPIC CHOLECYSTECTOMY WITH INTRAOPERATIVE CHOLANGIOGRAM;  Surgeon: Dorothyann LITTIE Husk, MD;  Location: [facility];  Service: General;  Laterality: N/A;   COLONOSCOPY WITH PROPOFOL  N/A 11/02/2016   Procedure: COLONOSCOPY WITH PROPOFOL ;  Surgeon: Jinny Carmine, MD;  Location: [facility];  Service: Endoscopy;  Laterality: N/A;   COLONOSCOPY WITH PROPOFOL  N/A 03/23/2022   Procedure: COLONOSCOPY WITH PROPOFOL ;  Surgeon: Jinny Carmine, MD;  Location: [facility];  Service: Endoscopy;  Laterality: N/A;   LUMBAR DISC SURGERY     L4-L5   WISDOM TOOTH EXTRACTION      Medications:  Current Outpatient Medications on File Prior to Visit  Medication Sig   aspirin 81 MG tablet Take 81 mg by mouth daily.   Turmeric 500 MG CAPS Take 1 capsule by mouth daily.   No current facility-administered medications on file prior to visit.    Allergies:  No Known Allergies  Social History:  Social History   Socioeconomic History   Marital status: Divorced    Spouse name: Not on file   Number of children: Not on file   Years of education: Not on file   Highest education level: Not on file  Occupational History   Not on file  Tobacco Use   Smoking status: Never   Smokeless tobacco: Never  Vaping Use   Vaping status: Never Used  Substance and Sexual Activity   Alcohol use: No    Alcohol/week: 0.0 standard drinks of alcohol   Drug use: No   Sexual activity: Yes  Other Topics Concern   Not on file   Social History Narrative   Not on file   Social Drivers of Health   Financial Resource Strain: Low Risk  (02/08/2023)   Overall Financial Resource Strain (CARDIA)    Difficulty of Paying Living Expenses: Not hard at all  Food Insecurity: No Food Insecurity (02/08/2023)   Hunger Vital Sign  Worried About Programme researcher, broadcasting/film/video in the Last Year: Never true    Ran Out of Food in the Last Year: Never true  Transportation Needs: No Transportation Needs (02/08/2023)   PRAPARE - Administrator, Civil Service (Medical): No    Lack of Transportation (Non-Medical): No  Physical Activity: Sufficiently Active (02/14/2024)   Exercise Vital Sign    Days of Exercise per Week: 7 days    Minutes of Exercise per Session: 100 min  Stress: No Stress Concern Present (02/08/2023)   [employer] of Occupational Health - Occupational Stress Questionnaire    Feeling of Stress : Not at all  Social Connections: Moderately Isolated (02/14/2024)   Social Connection and Isolation Panel    Frequency of Communication with Friends and Family: Twice a week    Frequency of Social Gatherings with Friends and Family: Once a week    Attends Religious Services: More than 4 times per year    Active Member of [employer] or Organizations: No    Attends Banker Meetings: Never    Marital Status: Divorced  Catering manager Violence: Not At Risk (02/08/2023)   Humiliation, Afraid, Rape, and Kick questionnaire    Fear of Current or Ex-Partner: No    Emotionally Abused: No    Physically Abused: No    Sexually Abused: No   Social History   Tobacco Use  Smoking Status Never  Smokeless Tobacco Never   Social History   Substance and Sexual Activity  Alcohol Use No   Alcohol/week: 0.0 standard drinks of alcohol    Family History:  Family History  Problem Relation Age of Onset   Asthma Mother    Stroke Mother    Migraines Mother    Thyroid disease Mother    Diabetes Mother    Heart disease  Father    Hyperlipidemia Father    Hypertension Father    Stroke Father    Hyperlipidemia Brother    Hypertension Brother    Diabetes Maternal Grandmother    Diabetes Maternal Grandfather    Heart disease Paternal Grandmother    Past medical history, surgical history, medications, allergies, family history and social history reviewed with patient today and changes made to appropriate areas of the chart.   ROS All other ROS negative except what is listed above and in the HPI.      Objective:    BP 121/74 (BP Location: Left Arm, Patient Position: Sitting, Cuff Size: Normal)   Pulse 75   Temp 98.1 F (36.7 C) (Oral)   Resp 15   Ht 5' 5.98 (1.676 m)   Wt 186 lb 12.8 oz (84.7 kg)   SpO2 97%   BMI 30.16 kg/m   Wt Readings from Last 3 Encounters:  02/14/24 186 lb 12.8 oz (84.7 kg)  08/09/23 191 lb (86.6 kg)  02/08/23 186 lb (84.4 kg)    No results found.  Physical Exam Vitals and nursing note reviewed.  Constitutional:      General: He is awake. He is not in acute distress.    Appearance: He is well-developed and well-groomed. He is obese. He is not ill-appearing or toxic-appearing.  HENT:     Head: Normocephalic and atraumatic.     Right Ear: Hearing, tympanic membrane, ear canal and external ear normal. No drainage.     Left Ear: Hearing, tympanic membrane, ear canal and external ear normal. No drainage.     Nose: Nose normal.     Mouth/Throat:  Pharynx: Uvula midline.  Eyes:     General: Lids are normal.        Right eye: No discharge.        Left eye: No discharge.     Extraocular Movements: Extraocular movements intact.     Conjunctiva/sclera: Conjunctivae normal.     Pupils: Pupils are equal, round, and reactive to light.     Visual Fields: Right eye visual fields normal and left eye visual fields normal.  Neck:     Thyroid: No thyromegaly.     Vascular: No carotid bruit or JVD.     Trachea: Trachea normal.  Cardiovascular:     Rate and Rhythm: Normal  rate and regular rhythm.     Heart sounds: Normal heart sounds, S1 normal and S2 normal. No murmur heard.    No gallop.  Pulmonary:     Effort: Pulmonary effort is normal. No accessory muscle usage or respiratory distress.     Breath sounds: Normal breath sounds.  Abdominal:     General: Bowel sounds are normal.     Palpations: Abdomen is soft. There is no hepatomegaly or splenomegaly.     Tenderness: There is no abdominal tenderness.  Musculoskeletal:        General: Normal range of motion.     Cervical back: Normal range of motion and neck supple.     Right lower leg: No edema.     Left lower leg: No edema.  Lymphadenopathy:     Head:     Right side of head: No submental, submandibular, tonsillar, preauricular or posterior auricular adenopathy.     Left side of head: No submental, submandibular, tonsillar, preauricular or posterior auricular adenopathy.     Cervical: No cervical adenopathy.  Skin:    General: Skin is warm and dry.     Capillary Refill: Capillary refill takes less than 2 seconds.     Findings: No rash.  Neurological:     Mental Status: He is alert and oriented to person, place, and time.     Gait: Gait is intact.     Deep Tendon Reflexes: Reflexes are normal and symmetric.     Reflex Scores:      Brachioradialis reflexes are 2+ on the right side and 2+ on the left side.      Patellar reflexes are 2+ on the right side and 2+ on the left side. Psychiatric:        Attention and Perception: Attention normal.        Mood and Affect: Mood normal.        Speech: Speech normal.        Behavior: Behavior normal. Behavior is cooperative.        Thought Content: Thought content normal.        Cognition and Memory: Cognition normal.       02/14/2024    8:15 AM 02/08/2023    1:33 PM  6CIT Screen  What Year? 0 points 0 points  What month? 0 points 0 points  What time? 0 points 0 points  Count back from 20 0 points 0 points  Months in reverse 0 points 0 points   Repeat phrase 2 points 4 points  Total Score 2 points 4 points    Results for orders placed or performed in visit on 08/09/23  Bayer DCA Hb A1c Waived   Collection Time: 08/09/23  8:38 AM  Result Value Ref Range   HB A1C (BAYER DCA - WAIVED) 5.7 (  H) 4.8 - 5.6 %  Microalbumin, Urine Waived   Collection Time: 08/09/23  8:38 AM  Result Value Ref Range   Microalb, Ur Waived 80 (H) 0 - 19 mg/L   Creatinine, Urine Waived 50 10 - 300 mg/dL   Microalb/Creat Ratio >300 (H) <30 mg/g  Comprehensive metabolic panel   Collection Time: 08/09/23  8:39 AM  Result Value Ref Range   Glucose 111 (H) 70 - 99 mg/dL   BUN 12 8 - 27 mg/dL   Creatinine, Ser 9.07 0.76 - 1.27 mg/dL   eGFR 92 >40 fO/fpw/8.26   BUN/Creatinine Ratio 13 10 - 24   Sodium 137 134 - 144 mmol/L   Potassium 4.1 3.5 - 5.2 mmol/L   Chloride 100 96 - 106 mmol/L   CO2 23 20 - 29 mmol/L   Calcium  9.5 8.6 - 10.2 mg/dL   Total Protein 6.9 6.0 - 8.5 g/dL   Albumin 4.6 3.9 - 4.9 g/dL   Globulin, Total 2.3 1.5 - 4.5 g/dL   Bilirubin Total 0.9 0.0 - 1.2 mg/dL   Alkaline Phosphatase 66 44 - 121 IU/L   AST 23 0 - 40 IU/L   ALT 36 0 - 44 IU/L  Lipid Panel w/o Chol/HDL Ratio   Collection Time: 08/09/23  8:39 AM  Result Value Ref Range   Cholesterol, Total 138 100 - 199 mg/dL   Triglycerides 881 0 - 149 mg/dL   HDL 45 >60 mg/dL   VLDL Cholesterol Cal 21 5 - 40 mg/dL   LDL Chol Calc (NIH) 72 0 - 99 mg/dL  CBC with Differential/Platelet   Collection Time: 08/09/23  8:39 AM  Result Value Ref Range   WBC 6.5 3.4 - 10.8 x10E3/uL   RBC 5.39 4.14 - 5.80 x10E6/uL   Hemoglobin 16.5 13.0 - 17.7 g/dL   Hematocrit 52.2 62.4 - 51.0 %   MCV 89 79 - 97 fL   MCH 30.6 26.6 - 33.0 pg   MCHC 34.6 31.5 - 35.7 g/dL   RDW 86.5 88.3 - 84.5 %   Platelets 152 150 - 450 x10E3/uL   Neutrophils 61 Not Estab. %   Lymphs 25 Not Estab. %   Monocytes 9 Not Estab. %   Eos 4 Not Estab. %   Basos 1 Not Estab. %   Neutrophils Absolute 4.0 1.4 - 7.0 x10E3/uL    Lymphocytes Absolute 1.6 0.7 - 3.1 x10E3/uL   Monocytes Absolute 0.6 0.1 - 0.9 x10E3/uL   EOS (ABSOLUTE) 0.2 0.0 - 0.4 x10E3/uL   Basophils Absolute 0.1 0.0 - 0.2 x10E3/uL   Immature Granulocytes 0 Not Estab. %   Immature Grans (Abs) 0.0 0.0 - 0.1 x10E3/uL      Assessment & Plan:   Problem List Items Addressed This Visit       Cardiovascular and Mediastinum   Essential hypertension, benign   Chronic, stable.  BP well below goal in office today.  Continue current medication regimen and adjust as needed.  Recommend checking BP at home at least a few mornings a week and documenting.  DASH diet focus.  LABS: CBC, CMP, TSH, lipid.  Refills sent, continue Lisinopril  for proteinuria (80 March 2025).  Return in 6 months.         Relevant Medications   lisinopril  (ZESTRIL ) 2.5 MG tablet   atorvastatin  (LIPITOR) 20 MG tablet   Other Relevant Orders   Comprehensive metabolic panel with GFR   TSH     Endocrine   Impaired fasting blood sugar  A1c 5.7% last check, remaining stable with no increase -- recheck today.  Continue to monitor and focus on diet.  Could consider Metformin, which discussed with patient.      Relevant Orders   Bayer DCA Hb A1c Waived     Hematopoietic and Hemostatic   Thrombocytopenia   Noted intermittently on past labs, has reduced Baby ASA to every other day. Level often in 140 range when low.  Recheck CBC today and monitor closely.      Relevant Orders   CBC with Differential/Platelet     Other   Obesity (BMI 30.0-34.9)   BMI 30.16.  Recommended eating smaller high protein, low fat meals more frequently and exercising 30 mins a day 5 times a week with a goal of 10-15lb weight loss in the next 3 months. Patient voiced their understanding and motivation to adhere to these recommendations.       Hyperlipidemia   Chronic, ongoing.  Continue current medication regimen and adjust as needed.  Continue heavy focus on diet.  Obtain lipid panel today.  Refills  sent.      Relevant Medications   lisinopril  (ZESTRIL ) 2.5 MG tablet   atorvastatin  (LIPITOR) 20 MG tablet   Other Relevant Orders   Comprehensive metabolic panel with GFR   Lipid Panel w/o Chol/HDL Ratio   Other Visit Diagnoses       Medicare annual wellness visit, subsequent    -  Primary   Medicare wellness performed today.     Benign prostatic hyperplasia without lower urinary tract symptoms       PSA on labs today.   Relevant Orders   PSA     Flu vaccine need       Flu vaccine in office today, educated on this.   Relevant Orders   Flu vaccine HIGH DOSE PF(Fluzone Trivalent) (Completed)     COVID-19 vaccine administered       Covid vaccine in office today.   Relevant Orders   Pfizer Comirnaty Covid-19 Vaccine 16yrs & older     Encounter for annual physical exam       Annual physical today with labs and health maintenance reviewed, discussed with patient.       Discussed aspirin prophylaxis for myocardial infarction prevention and decision was made to continue ASA (every other day).  LABORATORY TESTING:  Health maintenance labs ordered today as discussed above.   The natural history of prostate cancer and ongoing controversy regarding screening and potential treatment outcomes of prostate cancer has been discussed with the patient. The meaning of a false positive PSA and a false negative PSA has been discussed. He indicates understanding of the limitations of this screening test and wishes to proceed with screening PSA testing.  IMMUNIZATIONS:   - Tdap: Tetanus vaccination status reviewed: last tetanus booster within 10 years. - Influenza: Up to date - Pneumovax: Up to date - Prevnar: Up to date - Zostavax vaccine: Up to date  SCREENING: - Colonoscopy: Up to date  Discussed with patient purpose of the colonoscopy is to detect colon cancer at curable precancerous or early stages   - AAA Screening: Not applicable  -Hearing Test: Not applicable  -Spirometry: Not  applicable   PATIENT COUNSELING:    Sexuality: Discussed sexually transmitted diseases, partner selection, use of condoms, avoidance of unintended pregnancy  and contraceptive alternatives.   Advised to avoid cigarette smoking.  I discussed with the patient that most people either abstain from alcohol or drink within safe limits (<=14/week  and <=4 drinks/occasion for males, <=7/weeks and <= 3 drinks/occasion for females) and that the risk for alcohol disorders and other health effects rises proportionally with the number of drinks per week and how often a drinker exceeds daily limits.  Discussed cessation/primary prevention of drug use and availability of treatment for abuse.   Diet: Encouraged to adjust caloric intake to maintain  or achieve ideal body weight, to reduce intake of dietary saturated fat and total fat, to limit sodium intake by avoiding high sodium foods and not adding table salt, and to maintain adequate dietary potassium and calcium  preferably from fresh fruits, vegetables, and low-fat dairy products.    Stressed the importance of regular exercise  Injury prevention: Discussed safety belts, safety helmets, smoke detector, smoking near bedding or upholstery.   Dental health: Discussed importance of regular tooth brushing, flossing, and dental visits.   Follow up plan: NEXT PREVENTATIVE PHYSICAL DUE IN 1 YEAR. Return in about 6 months (around 08/13/2024) for HTN/HLD, IFG.

## 2024-02-14 NOTE — Assessment & Plan Note (Signed)
A1c 5.7% last check, remaining stable with no increase -- recheck today.  Continue to monitor and focus on diet.  Could consider Metformin, which discussed with patient.

## 2024-02-15 ENCOUNTER — Ambulatory Visit: Payer: Self-pay | Admitting: Nurse Practitioner

## 2024-02-15 LAB — CBC WITH DIFFERENTIAL/PLATELET
Basophils Absolute: 0.1 x10E3/uL (ref 0.0–0.2)
Basos: 1 %
EOS (ABSOLUTE): 0.1 x10E3/uL (ref 0.0–0.4)
Eos: 2 %
Hematocrit: 48.6 % (ref 37.5–51.0)
Hemoglobin: 16.4 g/dL (ref 13.0–17.7)
Immature Grans (Abs): 0 x10E3/uL (ref 0.0–0.1)
Immature Granulocytes: 0 %
Lymphocytes Absolute: 1.2 x10E3/uL (ref 0.7–3.1)
Lymphs: 16 %
MCH: 30.5 pg (ref 26.6–33.0)
MCHC: 33.7 g/dL (ref 31.5–35.7)
MCV: 90 fL (ref 79–97)
Monocytes Absolute: 0.6 x10E3/uL (ref 0.1–0.9)
Monocytes: 8 %
Neutrophils Absolute: 5.3 x10E3/uL (ref 1.4–7.0)
Neutrophils: 73 %
Platelets: 200 x10E3/uL (ref 150–450)
RBC: 5.38 x10E6/uL (ref 4.14–5.80)
RDW: 13.3 % (ref 11.6–15.4)
WBC: 7.2 x10E3/uL (ref 3.4–10.8)

## 2024-02-15 LAB — COMPREHENSIVE METABOLIC PANEL WITH GFR
ALT: 59 IU/L — ABNORMAL HIGH (ref 0–44)
AST: 26 IU/L (ref 0–40)
Albumin: 4.8 g/dL (ref 3.9–4.9)
Alkaline Phosphatase: 61 IU/L (ref 47–123)
BUN/Creatinine Ratio: 14 (ref 10–24)
BUN: 11 mg/dL (ref 8–27)
Bilirubin Total: 0.8 mg/dL (ref 0.0–1.2)
CO2: 23 mmol/L (ref 20–29)
Calcium: 9.5 mg/dL (ref 8.6–10.2)
Chloride: 101 mmol/L (ref 96–106)
Creatinine, Ser: 0.79 mg/dL (ref 0.76–1.27)
Globulin, Total: 2.2 g/dL (ref 1.5–4.5)
Glucose: 125 mg/dL — ABNORMAL HIGH (ref 70–99)
Potassium: 4.3 mmol/L (ref 3.5–5.2)
Sodium: 138 mmol/L (ref 134–144)
Total Protein: 7 g/dL (ref 6.0–8.5)
eGFR: 98 mL/min/1.73 (ref >59)

## 2024-02-15 LAB — LIPID PANEL W/O CHOL/HDL RATIO
Cholesterol, Total: 148 mg/dL (ref 100–199)
HDL: 44 mg/dL (ref >39)
LDL Chol Calc (NIH): 76 mg/dL (ref 0–99)
Triglycerides: 160 mg/dL — ABNORMAL HIGH (ref 0–149)
VLDL Cholesterol Cal: 28 mg/dL (ref 5–40)

## 2024-02-15 LAB — TSH: TSH: 1.58 u[IU]/mL (ref 0.450–4.500)

## 2024-02-15 LAB — PSA: Prostate Specific Ag, Serum: 1.5 ng/mL (ref 0.0–4.0)

## 2024-02-15 NOTE — Progress Notes (Signed)
 Contacted via MyChart  Good afternoon Colton Brown, your labs have returned: - Kidney function, creatinine and eGFR, remains normal. Liver function shows mild elevation in ALT, but normal AST.  We will continue to monitor this. - Lipid panel shows stable LDL level, but triglycerides a little elevated. Continue medication and focus on diet. - Remainder of labs stable. Any questions? Keep being amazing!!  Thank you for allowing me to participate in your care.  I appreciate you. Kindest regards, Fianna Snowball

## 2024-06-08 ENCOUNTER — Encounter: Payer: Self-pay | Admitting: Nurse Practitioner

## 2024-08-16 ENCOUNTER — Ambulatory Visit: Admitting: Nurse Practitioner

## 2024-08-21 ENCOUNTER — Ambulatory Visit: Admitting: Nurse Practitioner
